# Patient Record
Sex: Male | Born: 1965 | Race: White | Hispanic: No | Marital: Married | Smoking: Never smoker
Health system: Southern US, Community
[De-identification: ages and names within clinical notes are randomized; demographics above are authoritative.]

## PROBLEM LIST (undated history)

## (undated) DIAGNOSIS — Z21 Asymptomatic human immunodeficiency virus [HIV] infection status: Secondary | ICD-10-CM

## (undated) DIAGNOSIS — B20 Human immunodeficiency virus [HIV] disease: Secondary | ICD-10-CM

## (undated) DIAGNOSIS — B1910 Unspecified viral hepatitis B without hepatic coma: Secondary | ICD-10-CM

## (undated) DIAGNOSIS — K625 Hemorrhage of anus and rectum: Secondary | ICD-10-CM

## (undated) DIAGNOSIS — R51 Headache: Secondary | ICD-10-CM

## (undated) DIAGNOSIS — Z9852 Vasectomy status: Secondary | ICD-10-CM

## (undated) DIAGNOSIS — Z973 Presence of spectacles and contact lenses: Secondary | ICD-10-CM

## (undated) DIAGNOSIS — K4090 Unilateral inguinal hernia, without obstruction or gangrene, not specified as recurrent: Secondary | ICD-10-CM

## (undated) DIAGNOSIS — L290 Pruritus ani: Secondary | ICD-10-CM

## (undated) HISTORY — DX: Vasectomy status: Z98.52

## (undated) HISTORY — DX: Unilateral inguinal hernia, without obstruction or gangrene, not specified as recurrent: K40.90

## (undated) HISTORY — PX: HERNIA REPAIR: SHX51

## (undated) HISTORY — DX: Human immunodeficiency virus (HIV) disease: B20

## (undated) HISTORY — DX: Unspecified viral hepatitis B without hepatic coma: B19.10

## (undated) HISTORY — DX: Pruritus ani: L29.0

## (undated) HISTORY — PX: ANTERIOR CRUCIATE LIGAMENT REPAIR: SHX115

## (undated) HISTORY — DX: Headache: R51

## (undated) HISTORY — DX: Hemorrhage of anus and rectum: K62.5

## (undated) HISTORY — DX: Presence of spectacles and contact lenses: Z97.3

## (undated) HISTORY — DX: Asymptomatic human immunodeficiency virus (hiv) infection status: Z21

---

## 2003-06-14 ENCOUNTER — Emergency Department (HOSPITAL_COMMUNITY): Admission: EM | Admit: 2003-06-14 | Discharge: 2003-06-14 | Payer: Self-pay | Admitting: Emergency Medicine

## 2010-07-19 ENCOUNTER — Other Ambulatory Visit: Payer: Self-pay | Admitting: Family Medicine

## 2010-07-19 ENCOUNTER — Ambulatory Visit
Admission: RE | Admit: 2010-07-19 | Discharge: 2010-07-19 | Disposition: A | Discharge status: Home / Self Care | Payer: BLUE CROSS/BLUE SHIELD | Source: Ambulatory Visit | Attending: Family Medicine | Admitting: Family Medicine

## 2010-07-19 DIAGNOSIS — M25572 Pain in left ankle and joints of left foot: Secondary | ICD-10-CM

## 2010-07-30 ENCOUNTER — Ambulatory Visit: Payer: BC Managed Care – PPO

## 2010-07-30 DIAGNOSIS — F419 Anxiety disorder, unspecified: Secondary | ICD-10-CM

## 2010-07-30 DIAGNOSIS — B009 Herpesviral infection, unspecified: Secondary | ICD-10-CM

## 2010-07-30 DIAGNOSIS — Z8619 Personal history of other infectious and parasitic diseases: Secondary | ICD-10-CM

## 2010-07-30 DIAGNOSIS — B20 Human immunodeficiency virus [HIV] disease: Secondary | ICD-10-CM

## 2010-08-30 DIAGNOSIS — G43909 Migraine, unspecified, not intractable, without status migrainosus: Secondary | ICD-10-CM | POA: Insufficient documentation

## 2010-08-30 DIAGNOSIS — Z8619 Personal history of other infectious and parasitic diseases: Secondary | ICD-10-CM | POA: Insufficient documentation

## 2010-08-30 DIAGNOSIS — B20 Human immunodeficiency virus [HIV] disease: Secondary | ICD-10-CM | POA: Insufficient documentation

## 2010-08-30 DIAGNOSIS — Z21 Asymptomatic human immunodeficiency virus [HIV] infection status: Secondary | ICD-10-CM | POA: Insufficient documentation

## 2010-08-30 DIAGNOSIS — F419 Anxiety disorder, unspecified: Secondary | ICD-10-CM | POA: Insufficient documentation

## 2010-08-30 DIAGNOSIS — B009 Herpesviral infection, unspecified: Secondary | ICD-10-CM | POA: Insufficient documentation

## 2010-09-18 ENCOUNTER — Other Ambulatory Visit: Payer: BC Managed Care – PPO

## 2010-09-18 DIAGNOSIS — B20 Human immunodeficiency virus [HIV] disease: Secondary | ICD-10-CM

## 2010-09-18 DIAGNOSIS — Z79899 Other long term (current) drug therapy: Secondary | ICD-10-CM

## 2010-09-18 DIAGNOSIS — Z113 Encounter for screening for infections with a predominantly sexual mode of transmission: Secondary | ICD-10-CM

## 2010-09-18 LAB — COMPREHENSIVE METABOLIC PANEL
ALT: 15 U/L (ref 0–53)
AST: 19 U/L (ref 0–37)
Albumin: 4.1 g/dL (ref 3.5–5.2)
Alkaline Phosphatase: 64 U/L (ref 39–117)
BUN: 15 mg/dL (ref 6–23)
Potassium: 4.1 mEq/L (ref 3.5–5.3)

## 2010-09-18 LAB — CBC WITH DIFFERENTIAL/PLATELET
Basophils Absolute: 0 10*3/uL (ref 0.0–0.1)
Eosinophils Relative: 8 % — ABNORMAL HIGH (ref 0–5)
HCT: 43.6 % (ref 39.0–52.0)
Hemoglobin: 15.1 g/dL (ref 13.0–17.0)
Lymphocytes Relative: 46 % (ref 12–46)
MCHC: 34.6 g/dL (ref 30.0–36.0)
MCV: 92.4 fL (ref 78.0–100.0)
Monocytes Absolute: 0.3 10*3/uL (ref 0.1–1.0)
Monocytes Relative: 6 % (ref 3–12)
RDW: 12.7 % (ref 11.5–15.5)
WBC: 5 10*3/uL (ref 4.0–10.5)

## 2010-09-18 LAB — LIPID PANEL
Cholesterol: 184 mg/dL (ref 0–200)
Triglycerides: 125 mg/dL (ref <150)
VLDL: 25 mg/dL (ref 0–40)

## 2010-09-18 LAB — URINALYSIS, ROUTINE W REFLEX MICROSCOPIC
Bilirubin Urine: NEGATIVE
Leukocytes, UA: NEGATIVE
Nitrite: NEGATIVE
Protein, ur: NEGATIVE mg/dL
Specific Gravity, Urine: 1.026 (ref 1.005–1.030)
Urobilinogen, UA: 0.2 mg/dL (ref 0.0–1.0)

## 2010-09-18 LAB — RPR

## 2010-09-19 LAB — T-HELPER CELL (CD4) - (RCID CLINIC ONLY): CD4 % Helper T Cell: 37 % (ref 33–55)

## 2010-10-01 NOTE — Progress Notes (Signed)
Pt is transferring from [facility]. He lives in [place] and his wife is also a patient at this clinic.  Extensive records received for physician to review.  Pt is very compliant.  Complete intake not performed due to recent visit with [facility] ID. Pt will return in Aug. 2012 for labs and OV with Dr Luciana Axe.

## 2010-10-04 ENCOUNTER — Encounter: Payer: Self-pay | Admitting: Internal Medicine

## 2010-10-04 ENCOUNTER — Ambulatory Visit (INDEPENDENT_AMBULATORY_CARE_PROVIDER_SITE_OTHER): Payer: BC Managed Care – PPO | Admitting: Internal Medicine

## 2010-10-04 VITALS — BP 136/90 | HR 94 | Temp 98.4°F | Wt 186.5 lb

## 2010-10-04 DIAGNOSIS — B20 Human immunodeficiency virus [HIV] disease: Secondary | ICD-10-CM

## 2010-10-04 DIAGNOSIS — Z23 Encounter for immunization: Secondary | ICD-10-CM

## 2010-10-04 DIAGNOSIS — Z21 Asymptomatic human immunodeficiency virus [HIV] infection status: Secondary | ICD-10-CM

## 2010-10-04 MED ORDER — RALTEGRAVIR POTASSIUM 400 MG PO TABS: 400.0000 mg | ORAL_TABLET | Freq: Two times a day (BID) | ORAL | Status: DC

## 2010-10-04 MED ORDER — EMTRICITABINE-TENOFOVIR DF 200-300 MG PO TABS
1.0000 | ORAL_TABLET | Freq: Every day | ORAL | Status: DC
Start: 2010-10-04 — End: 2010-10-14

## 2010-10-04 NOTE — Assessment & Plan Note (Signed)
This patient is doing well on his current regimen and this will be continued. I did refill his prescriptions through [facility] and gave him 3 month supplies with 3 refills. I'm going to have him return in 4-5 months. I discussed condom use intervention. I also discussed long-term side effects of the medications. I discussed the benefits of exercise healthy eating and taking care of herself.

## 2010-10-04 NOTE — Progress Notes (Signed)
  Subjective:    Patient ID: Sean Lawrence, male    DOB: 11/15/1965, 45 y.o.   MRN: [id number]  HPI this patient is a 45 year old male with a history of HIV first diagnosed in February of 2011. Prior to that he did have a negative HIV test. He describes to me symptoms of acute HIV in September of 2010 and underwent HIV testing in February 2011 which was positive. He his initial workup was done at [facility] and he continued to followup there until this appointment. He was started on a regimen of I. Mordecai Maes and Truvada and has been taking that since. His CD4 nadir is 500 and his viral load soon became detectable after starting his regimen in April 2011.  He had been evaluated by nephrology at [facility] due to some mild proteinuria and elevated creatinine which has since resolved. 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 Other diagnoses at the time are considered however it did resolve with Valtrex and therefore he has continued.  Today he has no complaints and he continues to have 100% compliant with his regimen. He continues his work with [employer] access. He is married he is married to his wife who has a long history of HIV and is controlled on Atripla and has an undetectable viral load.    Review of Systems  Constitutional: Negative.   HENT: Negative.   Eyes: Negative.   Respiratory: Negative.   Cardiovascular: Negative.   Gastrointestinal: Negative.   Genitourinary: Negative.   Musculoskeletal: Negative.   Skin: Negative.   Neurological: Negative.   Hematological: Negative.   Psychiatric/Behavioral: Negative.        Objective:   Physical Exam  Constitutional: He is oriented to person, place, and time. He appears well-developed and well-nourished. No distress.  HENT:  Mouth/Throat: No oropharyngeal  exudate.  Eyes: No scleral icterus.  Neck: Normal range of motion. Neck supple.  Cardiovascular: Normal rate, regular rhythm and normal heart sounds.   No murmur heard. Pulmonary/Chest: Effort normal and breath sounds normal. No respiratory distress. He has no wheezes.  Abdominal: Soft. Bowel sounds are normal. There is no tenderness.  Lymphadenopathy:    He has no cervical adenopathy.  Neurological: He is alert and oriented to person, place, and time.  Skin: Skin is warm and dry. No erythema.  Psychiatric: He has a normal mood and affect. His behavior is normal.          Assessment & Plan:

## 2010-10-14 ENCOUNTER — Other Ambulatory Visit: Payer: Self-pay | Admitting: *Deleted

## 2010-10-14 ENCOUNTER — Telehealth: Payer: Self-pay | Admitting: *Deleted

## 2010-10-14 DIAGNOSIS — B009 Herpesviral infection, unspecified: Secondary | ICD-10-CM

## 2010-10-14 DIAGNOSIS — B20 Human immunodeficiency virus [HIV] disease: Secondary | ICD-10-CM

## 2010-10-14 MED ORDER — VALACYCLOVIR HCL 500 MG PO TABS: 500.0000 mg | ORAL_TABLET | Freq: Two times a day (BID) | ORAL | Status: DC

## 2010-10-14 MED ORDER — EMTRICITABINE-TENOFOVIR DF 200-300 MG PO TABS: 1.0000 | ORAL_TABLET | Freq: Every day | ORAL | Status: DC

## 2010-10-14 MED ORDER — RALTEGRAVIR POTASSIUM 400 MG PO TABS: 400.0000 mg | ORAL_TABLET | Freq: Two times a day (BID) | ORAL | Status: DC

## 2010-10-14 NOTE — Telephone Encounter (Signed)
Error

## 2011-02-06 ENCOUNTER — Other Ambulatory Visit (INDEPENDENT_AMBULATORY_CARE_PROVIDER_SITE_OTHER): Payer: BC Managed Care – PPO

## 2011-02-06 ENCOUNTER — Other Ambulatory Visit: Payer: Self-pay | Admitting: Infectious Diseases

## 2011-02-06 DIAGNOSIS — B20 Human immunodeficiency virus [HIV] disease: Secondary | ICD-10-CM

## 2011-02-06 LAB — COMPLETE METABOLIC PANEL WITH GFR
ALT: 16 U/L (ref 0–53)
AST: 19 U/L (ref 0–37)
Calcium: 9.4 mg/dL (ref 8.4–10.5)
Chloride: 110 mEq/L (ref 96–112)
Creat: 1.17 mg/dL (ref 0.50–1.35)
Sodium: 143 mEq/L (ref 135–145)
Total Bilirubin: 0.6 mg/dL (ref 0.3–1.2)
Total Protein: 6.6 g/dL (ref 6.0–8.3)

## 2011-02-06 LAB — CBC WITH DIFFERENTIAL/PLATELET
Eosinophils Absolute: 0.4 10*3/uL (ref 0.0–0.7)
Eosinophils Relative: 8 % — ABNORMAL HIGH (ref 0–5)
Hemoglobin: 15.4 g/dL (ref 13.0–17.0)
Lymphs Abs: 1.9 10*3/uL (ref 0.7–4.0)
MCH: 31.9 pg (ref 26.0–34.0)
MCHC: 34.7 g/dL (ref 30.0–36.0)
MCV: 91.9 fL (ref 78.0–100.0)
Monocytes Relative: 5 % (ref 3–12)
RBC: 4.83 MIL/uL (ref 4.22–5.81)

## 2011-02-07 LAB — T-HELPER CELL (CD4) - (RCID CLINIC ONLY): CD4 % Helper T Cell: 41 % (ref 33–55)

## 2011-02-20 ENCOUNTER — Telehealth: Payer: Self-pay | Admitting: *Deleted

## 2011-02-20 ENCOUNTER — Encounter: Payer: Self-pay | Admitting: Internal Medicine

## 2011-02-20 ENCOUNTER — Ambulatory Visit (INDEPENDENT_AMBULATORY_CARE_PROVIDER_SITE_OTHER): Payer: BC Managed Care – PPO | Admitting: Internal Medicine

## 2011-02-20 DIAGNOSIS — B20 Human immunodeficiency virus [HIV] disease: Secondary | ICD-10-CM

## 2011-02-20 DIAGNOSIS — Z21 Asymptomatic human immunodeficiency virus [HIV] infection status: Secondary | ICD-10-CM

## 2011-02-20 DIAGNOSIS — K648 Other hemorrhoids: Secondary | ICD-10-CM

## 2011-02-20 DIAGNOSIS — Z113 Encounter for screening for infections with a predominantly sexual mode of transmission: Secondary | ICD-10-CM

## 2011-02-20 NOTE — Telephone Encounter (Signed)
Called patient and notified of appointment with [facility] with Dr. Carolynne Edouard for 03/06/11 at 11:00 AM Sean Lawrence CMA

## 2011-02-21 ENCOUNTER — Encounter: Payer: Self-pay | Admitting: Internal Medicine

## 2011-02-21 DIAGNOSIS — K648 Other hemorrhoids: Secondary | ICD-10-CM | POA: Insufficient documentation

## 2011-02-21 NOTE — Progress Notes (Signed)
  Subjective:    Patient ID: Sean Lawrence, male    DOB: 10/16/1965, 46 y.o.   MRN: [id number]  HPI he comes in for routine followup.  He has continued on Isentress and Truvada which he started after diagnosis, which was Feb 2011.  He continues to have excellent compliance with no missed doses and no new issues.  His labs show an undetectable viral load.  He does have a history of a transient increase in his creat at [facility] but that resolved without intervention.      Review of Systems  Constitutional: Negative for fever, appetite change, fatigue and unexpected weight change.  HENT: Negative for sore throat and trouble swallowing.   Respiratory: Negative for cough and shortness of breath.   Cardiovascular: Negative for chest pain and leg swelling.  Gastrointestinal: Negative for nausea, abdominal pain and diarrhea.  Genitourinary: Negative for discharge and genital sores.  Musculoskeletal: Negative for myalgias and arthralgias.  Skin: Negative for rash.  Neurological: Negative for facial asymmetry and headaches.  Hematological: Negative for adenopathy.  Psychiatric/Behavioral: Negative for dysphoric mood. The patient is not nervous/anxious.        Objective:   Physical Exam  Constitutional: He is oriented to person, place, and time. He appears well-developed and well-nourished. No distress.  HENT:  Mouth/Throat: Oropharynx is clear and moist. No oropharyngeal exudate.  Cardiovascular: Normal rate, regular rhythm and normal heart sounds.  Exam reveals no gallop and no friction rub.   No murmur heard. Pulmonary/Chest: Effort normal and breath sounds normal. No respiratory distress. He has no wheezes.  Abdominal: Soft. Bowel sounds are normal. He exhibits no distension. There is no tenderness. There is no rebound.  Genitourinary: Penis normal. No penile tenderness.  Lymphadenopathy:    He has no cervical adenopathy.  Neurological: He is alert and oriented to person, place, and  time.  Skin: Skin is warm and dry. No rash noted. No erythema.  Psychiatric: He has a normal mood and affect. His behavior is normal.          Assessment & Plan:

## 2011-02-21 NOTE — Assessment & Plan Note (Signed)
He is complaining about hemorrhoids, nothing external.  I discussed possible treatments including sitz baths, preparation H, etc.. However he would prefer more definitive therapy.  I have referred him to surgery to further evaluate.

## 2011-02-21 NOTE — Assessment & Plan Note (Signed)
He has good compliance and no new issues.  He did ask about crushing Atripla (his wife's medicine) and I will check with the pharmacist.  He will return in 6 months, sooner as indicated.

## 2011-03-06 ENCOUNTER — Ambulatory Visit (INDEPENDENT_AMBULATORY_CARE_PROVIDER_SITE_OTHER): Payer: BC Managed Care – PPO | Admitting: General Surgery

## 2011-03-06 ENCOUNTER — Encounter (INDEPENDENT_AMBULATORY_CARE_PROVIDER_SITE_OTHER): Payer: Self-pay | Admitting: General Surgery

## 2011-03-06 VITALS — BP 132/90 | HR 65 | Temp 99.2°F | Ht 71.0 in | Wt 179.6 lb

## 2011-03-06 DIAGNOSIS — L290 Pruritus ani: Secondary | ICD-10-CM

## 2011-03-06 NOTE — Progress Notes (Signed)
Subjective:     Patient ID: Sean Lawrence, male   DOB: [birth year], 46 y.o.   MRN: [id number]  HPI We're asked to see the patient in consultation by Dr. Staci Righter to evaluate him for hemorrhoids. The patient is a 46 year old white male who has had a history of occasional bleeding when he wipes after a bowel movement for the last 8-10 years. He also notes a burning sensation after bowel movements but no acute pain. He denies any fevers or chills. He denies any problems with constipation. He does have a history of HIV and the medicines that he has to take for that cause him to have very soft bowel movements.  Review of Systems  Constitutional: Negative.   HENT: Negative.   Eyes: Negative.   Respiratory: Negative.   Cardiovascular: Negative.   Gastrointestinal: Positive for rectal pain.  Genitourinary: Negative.   Musculoskeletal: Negative.   Skin: Negative.   Neurological: Negative.   Hematological: Negative.   Psychiatric/Behavioral: Negative.        Objective:   Physical Exam  Constitutional: He is oriented to person, place, and time. He appears well-developed and well-nourished.  HENT:  Head: Normocephalic and atraumatic.  Eyes: Conjunctivae and EOM are normal. Pupils are equal, round, and reactive to light.  Neck: Normal range of motion. Neck supple.  Cardiovascular: Normal rate, regular rhythm and normal heart sounds.   Pulmonary/Chest: Effort normal and breath sounds normal.  Abdominal: Soft. Bowel sounds are normal.  Genitourinary:       His perirectal skin has some mild circumferential irritation with some cracking of the skin. No obvious external hemorrhoids. On digital exam there is no mass. On anoscopic exam he has no significantly enlarged internal hemorrhoidal tissue or irritation  Musculoskeletal: Normal range of motion.  Neurological: He is alert and oriented to person, place, and time.  Skin: Skin is warm and dry.  Psychiatric: He has a normal mood and affect.  His behavior is normal.       Assessment:     Probable pruritus ani    Plan:     At this point I would recommend using baby wipes after bowel movements. We will have him start using Calmoseptine cream 2-3 times a day and after bowel movements. We will plan to see him back in about a month to check his progress

## 2011-03-06 NOTE — Patient Instructions (Signed)
Baby wipes after BM's Calmoseptine to rectum 2-3 times a day and as needed

## 2011-03-22 ENCOUNTER — Other Ambulatory Visit: Payer: Self-pay | Admitting: Internal Medicine

## 2011-04-07 ENCOUNTER — Encounter (INDEPENDENT_AMBULATORY_CARE_PROVIDER_SITE_OTHER): Payer: Self-pay | Admitting: General Surgery

## 2011-04-07 ENCOUNTER — Ambulatory Visit (INDEPENDENT_AMBULATORY_CARE_PROVIDER_SITE_OTHER): Payer: BC Managed Care – PPO | Admitting: General Surgery

## 2011-04-07 VITALS — BP 128/72 | HR 64 | Temp 99.1°F | Resp 18 | Ht 71.0 in | Wt 182.4 lb

## 2011-04-07 DIAGNOSIS — L290 Pruritus ani: Secondary | ICD-10-CM

## 2011-04-07 NOTE — Patient Instructions (Signed)
Continue with a barrier cream daily

## 2011-04-08 ENCOUNTER — Encounter (INDEPENDENT_AMBULATORY_CARE_PROVIDER_SITE_OTHER): Payer: Self-pay | Admitting: General Surgery

## 2011-04-08 NOTE — Progress Notes (Signed)
Subjective:     Patient ID: Sean Lawrence, male   DOB: 1965/06/10, 46 y.o.   MRN: [id number]  HPI The patient is a 46 year old white male who saw about a month ago with pruritus ani. Since then he has been using Calmoseptine cream several times a day and noted significant improvement. He denies any rectal discomfort. He has not had any bleeding with his bowel movements. Overall he is very happy.  Review of Systems     Objective:   Physical Exam On exam his perirectal skin looks much healthier. There were only a couple little areas of minor irritation.    Assessment:     Pruritus ani nearly resolved    Plan:     At this point I think he can use a barrier cream on a daily basis to continue keeping his perirectal skin healthy. We will plan to see him back on a p.r.n. basis

## 2011-08-12 ENCOUNTER — Other Ambulatory Visit: Payer: Self-pay | Admitting: Internal Medicine

## 2011-08-26 ENCOUNTER — Other Ambulatory Visit: Payer: BC Managed Care – PPO

## 2011-08-26 DIAGNOSIS — B20 Human immunodeficiency virus [HIV] disease: Secondary | ICD-10-CM

## 2011-08-26 DIAGNOSIS — Z113 Encounter for screening for infections with a predominantly sexual mode of transmission: Secondary | ICD-10-CM

## 2011-08-26 LAB — COMPREHENSIVE METABOLIC PANEL
ALT: 28 U/L (ref 0–53)
AST: 24 U/L (ref 0–37)
Albumin: 4.1 g/dL (ref 3.5–5.2)
Calcium: 9 mg/dL (ref 8.4–10.5)
Chloride: 110 mEq/L (ref 96–112)
Potassium: 3.9 mEq/L (ref 3.5–5.3)

## 2011-08-26 LAB — CBC WITH DIFFERENTIAL/PLATELET
Basophils Absolute: 0 10*3/uL (ref 0.0–0.1)
HCT: 40.4 % (ref 39.0–52.0)
Lymphocytes Relative: 34 % (ref 12–46)
Lymphs Abs: 1.7 10*3/uL (ref 0.7–4.0)
Monocytes Absolute: 0.3 10*3/uL (ref 0.1–1.0)
Neutro Abs: 2.3 10*3/uL (ref 1.7–7.7)
Platelets: 185 10*3/uL (ref 150–400)
RBC: 4.53 MIL/uL (ref 4.22–5.81)
RDW: 13.8 % (ref 11.5–15.5)
WBC: 4.9 10*3/uL (ref 4.0–10.5)

## 2011-08-27 LAB — RPR

## 2011-09-09 ENCOUNTER — Encounter: Payer: Self-pay | Admitting: Internal Medicine

## 2011-09-09 ENCOUNTER — Ambulatory Visit (INDEPENDENT_AMBULATORY_CARE_PROVIDER_SITE_OTHER): Payer: BC Managed Care – PPO | Admitting: Internal Medicine

## 2011-09-09 VITALS — BP 125/85 | HR 83 | Temp 98.4°F | Ht 71.0 in | Wt 183.0 lb

## 2011-09-09 DIAGNOSIS — Z21 Asymptomatic human immunodeficiency virus [HIV] infection status: Secondary | ICD-10-CM

## 2011-09-09 DIAGNOSIS — B009 Herpesviral infection, unspecified: Secondary | ICD-10-CM

## 2011-09-09 DIAGNOSIS — B20 Human immunodeficiency virus [HIV] disease: Secondary | ICD-10-CM

## 2011-09-09 DIAGNOSIS — Z113 Encounter for screening for infections with a predominantly sexual mode of transmission: Secondary | ICD-10-CM

## 2011-09-09 DIAGNOSIS — Z79899 Other long term (current) drug therapy: Secondary | ICD-10-CM

## 2011-09-09 NOTE — Assessment & Plan Note (Signed)
He continues to do well with excellent compliance. No new issues. He will continue with his current regimen. Turn in 6 months.

## 2011-09-09 NOTE — Progress Notes (Signed)
  Subjective:    Patient ID: Sean Lawrence, male    DOB: May 19, 1965, 46 y.o.   MRN: [id number]  HPI Sean Lawrence comes in here for his routine followup. He continues on his current regimen of Isentress and Truvada.  He continues to have excellent adherence and tolerance.  No new issues.  He does take Valtrex for HSV prophylaxis but in futher discussion, it appears he never had a positive antibody.  His wife does have it and takes prophylaxis.  No recent hospitalizations.  He continues to lead weekly discussion groups.     Review of Systems  Constitutional: Negative for fever, chills, activity change, fatigue and unexpected weight change.  HENT: Negative for sore throat and trouble swallowing.   Respiratory: Negative for cough and shortness of breath.   Cardiovascular: Negative for chest pain, palpitations and leg swelling.  Gastrointestinal: Negative for nausea, abdominal pain and diarrhea.  Musculoskeletal: Negative for myalgias, joint swelling and arthralgias.  Skin: Negative for rash.  Neurological: Negative for dizziness.  Hematological: Negative for adenopathy.  Psychiatric/Behavioral: Negative for dysphoric mood. The patient is not nervous/anxious.        Objective:   Physical Exam  Constitutional: He appears well-developed and well-nourished. No distress.  HENT:  Mouth/Throat: Oropharynx is clear and moist. No oropharyngeal exudate.  Cardiovascular: Normal rate, regular rhythm and normal heart sounds.  Exam reveals no gallop and no friction rub.   No murmur heard. Pulmonary/Chest: Effort normal and breath sounds normal. No respiratory distress. He has no wheezes. He has no rales.  Lymphadenopathy:    He has no cervical adenopathy.          Assessment & Plan:

## 2011-09-09 NOTE — Assessment & Plan Note (Signed)
I will check the antibodies today. He may not need suppressive therapy. If it is positive, I will continue with Valtrex 500 mg

## 2011-09-10 ENCOUNTER — Other Ambulatory Visit: Payer: Self-pay | Admitting: Internal Medicine

## 2011-09-10 ENCOUNTER — Telehealth: Payer: Self-pay | Admitting: *Deleted

## 2011-09-10 LAB — HSV(HERPES SIMPLEX VRS) I + II AB-IGG
HSV 1 Glycoprotein G Ab, IgG: <0.1 IV
HSV 2 Glycoprotein G Ab, IgG: <0.1 IV

## 2011-09-10 NOTE — Telephone Encounter (Signed)
Message copied by Macy Mis on Wed Sep 10, 2011  2:58 PM ------      Message from: Gardiner Barefoot      Created: Wed Sep 10, 2011  2:38 PM       Can you let Mr. Ramsay know that his HSV antibodies are negative for new or old herpes infection.  Valtrex suppression is not indicated for him.  Thanks.

## 2011-09-10 NOTE — Telephone Encounter (Signed)
Patient notified. Mauriana Dann CMA  

## 2012-03-09 ENCOUNTER — Other Ambulatory Visit (INDEPENDENT_AMBULATORY_CARE_PROVIDER_SITE_OTHER): Payer: BC Managed Care – PPO

## 2012-03-09 DIAGNOSIS — B20 Human immunodeficiency virus [HIV] disease: Secondary | ICD-10-CM

## 2012-03-09 DIAGNOSIS — Z79899 Other long term (current) drug therapy: Secondary | ICD-10-CM

## 2012-03-09 DIAGNOSIS — Z113 Encounter for screening for infections with a predominantly sexual mode of transmission: Secondary | ICD-10-CM

## 2012-03-09 LAB — COMPLETE METABOLIC PANEL WITH GFR
ALT: 30 U/L (ref 0–53)
AST: 14 U/L (ref 0–37)
Albumin: 4.1 g/dL (ref 3.5–5.2)
Calcium: 9.2 mg/dL (ref 8.4–10.5)
Chloride: 110 mEq/L (ref 96–112)
Creat: 1.03 mg/dL (ref 0.50–1.35)
Potassium: 3.9 mEq/L (ref 3.5–5.3)
Sodium: 142 mEq/L (ref 135–145)
Total Protein: 6.5 g/dL (ref 6.0–8.3)

## 2012-03-09 LAB — LIPID PANEL
Cholesterol: 133 mg/dL (ref 0–200)
Total CHOL/HDL Ratio: 4.9 Ratio
VLDL: 19 mg/dL (ref 0–40)

## 2012-03-10 LAB — CBC WITH DIFFERENTIAL/PLATELET
Basophils Absolute: 0 10*3/uL (ref 0.0–0.1)
Basophils Relative: 1 % (ref 0–1)
Eosinophils Absolute: 0.5 10*3/uL (ref 0.0–0.7)
Eosinophils Relative: 8 % — ABNORMAL HIGH (ref 0–5)
HCT: 40.1 % (ref 39.0–52.0)
Hemoglobin: 14.2 g/dL (ref 13.0–17.0)
Lymphocytes Relative: 32 % (ref 12–46)
Lymphs Abs: 1.9 10*3/uL (ref 0.7–4.0)
MCH: 31.3 pg (ref 26.0–34.0)
MCHC: 35.4 g/dL (ref 30.0–36.0)
MCV: 88.3 fL (ref 78.0–100.0)
Monocytes Absolute: 0.4 10*3/uL (ref 0.1–1.0)
Monocytes Relative: 7 % (ref 3–12)
Neutro Abs: 3.1 10*3/uL (ref 1.7–7.7)
Neutrophils Relative %: 52 % (ref 43–77)
Platelets: 249 10*3/uL (ref 150–400)
RBC: 4.54 MIL/uL (ref 4.22–5.81)
RDW: 13.4 % (ref 11.5–15.5)
WBC: 5.8 10*3/uL (ref 4.0–10.5)

## 2012-03-10 LAB — T-HELPER CELL (CD4) - (RCID CLINIC ONLY): CD4 % Helper T Cell: 41 % (ref 33–55)

## 2012-03-10 LAB — HIV-1 RNA QUANT-NO REFLEX-BLD: HIV-1 RNA Quant, Log: <1.3 {Log} (ref <1.30)

## 2012-04-01 ENCOUNTER — Ambulatory Visit (INDEPENDENT_AMBULATORY_CARE_PROVIDER_SITE_OTHER): Payer: BC Managed Care – PPO | Admitting: Internal Medicine

## 2012-04-01 ENCOUNTER — Encounter: Payer: Self-pay | Admitting: Internal Medicine

## 2012-04-01 VITALS — BP 147/95 | HR 67 | Temp 98.2°F | Ht 71.0 in | Wt 180.0 lb

## 2012-04-01 DIAGNOSIS — Z23 Encounter for immunization: Secondary | ICD-10-CM

## 2012-04-01 DIAGNOSIS — B20 Human immunodeficiency virus [HIV] disease: Secondary | ICD-10-CM

## 2012-04-01 NOTE — Progress Notes (Signed)
  Subjective:    Patient ID: Sean Lawrence, male    DOB: 1965-06-20, 47 y.o.   MRN: [id number]  HPI  Sean Lawrence comes in here for his routine followup. He continues on his current regimen of Isentress and Truvada.  He continues to have excellent adherence and tolerance.  No new issues.  His wife does have it and takes prophylaxis.  No recent hospitalizations.  He continues to lead weekly discussion groups.     Review of Systems  Constitutional: Negative for fever, chills, activity change, fatigue and unexpected weight change.  HENT: Negative for sore throat and trouble swallowing.   Respiratory: Negative for cough and shortness of breath.   Cardiovascular: Negative for chest pain, palpitations and leg swelling.  Gastrointestinal: Negative for nausea, abdominal pain and diarrhea.  Musculoskeletal: Negative for myalgias, joint swelling and arthralgias.  Skin: Negative for rash.  Neurological: Negative for dizziness.  Hematological: Negative for adenopathy.  Psychiatric/Behavioral: Negative for dysphoric mood. The patient is not nervous/anxious.        Objective:   Physical Exam  Constitutional: He appears well-developed and well-nourished. No distress.  HENT:  Mouth/Throat: Oropharynx is clear and moist. No oropharyngeal exudate.  Cardiovascular: Normal rate, regular rhythm and normal heart sounds.  Exam reveals no gallop and no friction rub.   No murmur heard. Pulmonary/Chest: Effort normal and breath sounds normal. No respiratory distress. He has no wheezes. He has no rales.  Lymphadenopathy:    He has no cervical adenopathy.          Assessment & Plan:

## 2012-04-01 NOTE — Assessment & Plan Note (Signed)
He continues to do well with no missed doses. He will return on his routine six-month basis. He does get his primary care from his principle physician.

## 2012-08-13 ENCOUNTER — Other Ambulatory Visit: Payer: Self-pay | Admitting: Licensed Clinical Social Worker

## 2012-08-13 DIAGNOSIS — B20 Human immunodeficiency virus [HIV] disease: Secondary | ICD-10-CM

## 2012-08-13 MED ORDER — RALTEGRAVIR POTASSIUM 400 MG PO TABS: ORAL_TABLET | ORAL | Status: DC

## 2012-08-13 MED ORDER — EMTRICITABINE-TENOFOVIR DF 200-300 MG PO TABS: ORAL_TABLET | ORAL | Status: DC

## 2012-09-14 ENCOUNTER — Other Ambulatory Visit: Payer: BC Managed Care – PPO

## 2012-09-14 DIAGNOSIS — B20 Human immunodeficiency virus [HIV] disease: Secondary | ICD-10-CM

## 2012-09-14 LAB — CBC WITH DIFFERENTIAL/PLATELET
Basophils Absolute: 0 10*3/uL (ref 0.0–0.1)
Basophils Relative: 1 % (ref 0–1)
Eosinophils Absolute: 0.5 10*3/uL (ref 0.0–0.7)
Hemoglobin: 14.8 g/dL (ref 13.0–17.0)
MCH: 30.8 pg (ref 26.0–34.0)
MCHC: 34.3 g/dL (ref 30.0–36.0)
Monocytes Relative: 6 % (ref 3–12)
Neutro Abs: 2.5 10*3/uL (ref 1.7–7.7)
Neutrophils Relative %: 45 % (ref 43–77)
Platelets: 214 10*3/uL (ref 150–400)
RDW: 13.9 % (ref 11.5–15.5)

## 2012-09-15 LAB — COMPLETE METABOLIC PANEL WITH GFR
AST: 15 U/L (ref 0–37)
Albumin: 4.2 g/dL (ref 3.5–5.2)
Alkaline Phosphatase: 69 U/L (ref 39–117)
GFR, Est Non African American: 83 mL/min
Glucose, Bld: 93 mg/dL (ref 70–99)
Potassium: 3.7 mEq/L (ref 3.5–5.3)
Sodium: 142 mEq/L (ref 135–145)
Total Bilirubin: 0.5 mg/dL (ref 0.3–1.2)
Total Protein: 6.8 g/dL (ref 6.0–8.3)

## 2012-09-16 LAB — T-HELPER CELL (CD4) - (RCID CLINIC ONLY)
CD4 % Helper T Cell: 40 % (ref 33–55)
CD4 T Cell Abs: 880 uL (ref 400–2700)

## 2012-09-16 LAB — HIV-1 RNA QUANT-NO REFLEX-BLD
HIV 1 RNA Quant: <20 copies/mL (ref <20)
HIV-1 RNA Quant, Log: <1.3 {Log} (ref <1.30)

## 2012-09-28 ENCOUNTER — Ambulatory Visit (INDEPENDENT_AMBULATORY_CARE_PROVIDER_SITE_OTHER): Payer: BC Managed Care – PPO | Admitting: Internal Medicine

## 2012-09-28 ENCOUNTER — Encounter: Payer: Self-pay | Admitting: Internal Medicine

## 2012-09-28 ENCOUNTER — Telehealth: Payer: Self-pay | Admitting: *Deleted

## 2012-09-28 VITALS — BP 130/78 | HR 69 | Temp 98.4°F | Ht 71.0 in | Wt 177.0 lb

## 2012-09-28 DIAGNOSIS — Z21 Asymptomatic human immunodeficiency virus [HIV] infection status: Secondary | ICD-10-CM

## 2012-09-28 DIAGNOSIS — B20 Human immunodeficiency virus [HIV] disease: Secondary | ICD-10-CM

## 2012-09-28 DIAGNOSIS — Z113 Encounter for screening for infections with a predominantly sexual mode of transmission: Secondary | ICD-10-CM

## 2012-09-28 DIAGNOSIS — Z79899 Other long term (current) drug therapy: Secondary | ICD-10-CM

## 2012-09-28 MED ORDER — DOLUTEGRAVIR SODIUM 50 MG PO TABS: 50.0000 mg | ORAL_TABLET | Freq: Every day | ORAL | Status: DC

## 2012-09-28 MED ORDER — EMTRICITABINE-TENOFOVIR DF 200-300 MG PO TABS: ORAL_TABLET | ORAL | Status: DC

## 2012-09-28 NOTE — Telephone Encounter (Signed)
Voice mail from patient requesting Dr. Ephriam Knuckles email to relay medication information. Called back and left him a voicemail stating that since he is active on my chart we prefer he communicate this way. If he has any questions he can call triage nurse back in the AM. Sean Lawrence

## 2012-09-28 NOTE — Assessment & Plan Note (Signed)
He is doing well with his regimen and I am going to change him with his next refill to tivicay with Truvada for ease of dosing. Since he is on Topamax I will avoid the booster was Stribild. Followup in 6 months with fasting labs.

## 2012-09-28 NOTE — Progress Notes (Signed)
  Subjective:    Patient ID: Sean Lawrence, male    DOB: 1965-03-20, 47 y.o.   MRN: [id number]  HPI He comes in for routine followup. He has been on Isentress and Truvada for several years and tolerates it well. He denies any missed doses. No weight loss or diarrhea. No new issues. He has had some back pains go and see his primary physician.   Review of Systems  Constitutional: Negative for fever, chills and unexpected weight change.  HENT: Negative for sore throat and trouble swallowing.   Eyes: Negative for visual disturbance.  Respiratory: Negative for cough and shortness of breath.   Cardiovascular: Negative for chest pain and leg swelling.  Gastrointestinal: Negative for nausea, abdominal pain and diarrhea.  Musculoskeletal: Positive for back pain.  Skin: Negative for rash.  Neurological: Negative for dizziness, light-headedness and headaches.  Hematological: Negative for adenopathy.  Psychiatric/Behavioral: Negative for dysphoric mood.       Objective:   Physical Exam  Constitutional: He is oriented to person, place, and time. He appears well-developed and well-nourished. No distress.  HENT:  Mouth/Throat: No oropharyngeal exudate.  Eyes: Right eye exhibits no discharge. Left eye exhibits no discharge. No scleral icterus.  Cardiovascular: Normal rate, regular rhythm and normal heart sounds.   No murmur heard. Pulmonary/Chest: Effort normal and breath sounds normal. No respiratory distress.  Lymphadenopathy:    He has no cervical adenopathy.  Neurological: He is alert and oriented to person, place, and time.  Skin: Skin is warm and dry. No rash noted.  Psychiatric: He has a normal mood and affect. His behavior is normal.          Assessment & Plan:

## 2012-09-29 ENCOUNTER — Encounter: Payer: Self-pay | Admitting: Internal Medicine

## 2012-12-06 ENCOUNTER — Ambulatory Visit (INDEPENDENT_AMBULATORY_CARE_PROVIDER_SITE_OTHER): Payer: BC Managed Care – PPO

## 2012-12-06 DIAGNOSIS — Z23 Encounter for immunization: Secondary | ICD-10-CM

## 2013-03-24 ENCOUNTER — Other Ambulatory Visit: Payer: BC Managed Care – PPO

## 2013-03-24 DIAGNOSIS — Z79899 Other long term (current) drug therapy: Secondary | ICD-10-CM

## 2013-03-24 DIAGNOSIS — Z113 Encounter for screening for infections with a predominantly sexual mode of transmission: Secondary | ICD-10-CM

## 2013-03-24 DIAGNOSIS — B20 Human immunodeficiency virus [HIV] disease: Secondary | ICD-10-CM

## 2013-03-24 LAB — RPR

## 2013-03-24 LAB — LIPID PANEL
CHOL/HDL RATIO: 4.2 ratio
Cholesterol: 154 mg/dL (ref 0–200)
HDL: 37 mg/dL — AB (ref >39)
LDL Cholesterol: 99 mg/dL (ref 0–99)
Triglycerides: 88 mg/dL (ref <150)
VLDL: 18 mg/dL (ref 0–40)

## 2013-03-24 LAB — CBC WITH DIFFERENTIAL/PLATELET
BASOS PCT: 1 % (ref 0–1)
Basophils Absolute: 0.1 10*3/uL (ref 0.0–0.1)
EOS ABS: 0.5 10*3/uL (ref 0.0–0.7)
EOS PCT: 10 % — AB (ref 0–5)
HCT: 43.2 % (ref 39.0–52.0)
Hemoglobin: 15.8 g/dL (ref 13.0–17.0)
LYMPHS ABS: 1.9 10*3/uL (ref 0.7–4.0)
Lymphocytes Relative: 35 % (ref 12–46)
MCH: 32.3 pg (ref 26.0–34.0)
MCHC: 36.6 g/dL — AB (ref 30.0–36.0)
MCV: 88.3 fL (ref 78.0–100.0)
MONOS PCT: 6 % (ref 3–12)
Monocytes Absolute: 0.3 10*3/uL (ref 0.1–1.0)
Neutro Abs: 2.5 10*3/uL (ref 1.7–7.7)
Neutrophils Relative %: 48 % (ref 43–77)
PLATELETS: 207 10*3/uL (ref 150–400)
RBC: 4.89 MIL/uL (ref 4.22–5.81)
RDW: 13.5 % (ref 11.5–15.5)
WBC: 5.3 10*3/uL (ref 4.0–10.5)

## 2013-03-24 LAB — COMPLETE METABOLIC PANEL WITH GFR
ALT: 25 U/L (ref 0–53)
AST: 26 U/L (ref 0–37)
Albumin: 4.4 g/dL (ref 3.5–5.2)
Alkaline Phosphatase: 58 U/L (ref 39–117)
BUN: 14 mg/dL (ref 6–23)
CO2: 24 mEq/L (ref 19–32)
Calcium: 9.2 mg/dL (ref 8.4–10.5)
Chloride: 109 mEq/L (ref 96–112)
Creat: 1.26 mg/dL (ref 0.50–1.35)
GFR, Est African American: 77 mL/min
GFR, Est Non African American: 67 mL/min
Glucose, Bld: 86 mg/dL (ref 70–99)
POTASSIUM: 3.8 meq/L (ref 3.5–5.3)
Sodium: 143 mEq/L (ref 135–145)
TOTAL PROTEIN: 6.4 g/dL (ref 6.0–8.3)
Total Bilirubin: 1 mg/dL (ref 0.2–1.2)

## 2013-03-25 LAB — T-HELPER CELL (CD4) - (RCID CLINIC ONLY)
CD4 % Helper T Cell: 40 % (ref 33–55)
CD4 T Cell Abs: 730 /uL (ref 400–2700)

## 2013-03-26 LAB — HIV-1 RNA QUANT-NO REFLEX-BLD: HIV 1 RNA Quant: <20 copies/mL (ref <20)

## 2013-04-07 ENCOUNTER — Ambulatory Visit (INDEPENDENT_AMBULATORY_CARE_PROVIDER_SITE_OTHER): Payer: BC Managed Care – PPO | Admitting: Internal Medicine

## 2013-04-07 ENCOUNTER — Encounter: Payer: Self-pay | Admitting: Internal Medicine

## 2013-04-07 VITALS — BP 110/68 | HR 75 | Temp 98.4°F | Ht 71.0 in | Wt 173.0 lb

## 2013-04-07 DIAGNOSIS — Z23 Encounter for immunization: Secondary | ICD-10-CM

## 2013-04-07 DIAGNOSIS — B20 Human immunodeficiency virus [HIV] disease: Secondary | ICD-10-CM

## 2013-04-07 DIAGNOSIS — Z21 Asymptomatic human immunodeficiency virus [HIV] infection status: Secondary | ICD-10-CM

## 2013-04-07 NOTE — Progress Notes (Signed)
  Subjective:    Patient ID: Sean Lawrence, male    DOB: March 03, 1965, 48 y.o.   MRN: [id number]  HPI  He comes in for routine followup. He has been on Isentress and Truvada for several years and tolerates it well. He denies any missed doses. No weight loss or diarrhea. No new issues.    Review of Systems  Constitutional: Negative for fever, chills and unexpected weight change.  HENT: Negative for sore throat and trouble swallowing.   Eyes: Negative for visual disturbance.  Respiratory: Negative for cough and shortness of breath.   Cardiovascular: Negative for chest pain and leg swelling.  Gastrointestinal: Negative for nausea, abdominal pain and diarrhea.  Musculoskeletal: Positive for back pain.  Skin: Negative for rash.  Neurological: Negative for dizziness, light-headedness and headaches.  Hematological: Negative for adenopathy.  Psychiatric/Behavioral: Negative for dysphoric mood.       Objective:   Physical Exam  Constitutional: He is oriented to person, place, and time. He appears well-developed and well-nourished. No distress.  HENT:  Mouth/Throat: No oropharyngeal exudate.  Eyes: Right eye exhibits no discharge. Left eye exhibits no discharge. No scleral icterus.  Cardiovascular: Normal rate, regular rhythm and normal heart sounds.   No murmur heard. Pulmonary/Chest: Effort normal and breath sounds normal. No respiratory distress.  Lymphadenopathy:    He has no cervical adenopathy.  Neurological: He is alert and oriented to person, place, and time.  Skin: Skin is warm and dry. No rash noted.  Psychiatric: He has a normal mood and affect. His behavior is normal.          Assessment & Plan:

## 2013-04-07 NOTE — Assessment & Plan Note (Signed)
Continues to do well and RTC in 6 months.

## 2013-04-07 NOTE — Addendum Note (Signed)
Addended by: Landis Gandy on: 04/07/2013 09:04 AM   Modules accepted: Orders

## 2013-09-20 ENCOUNTER — Other Ambulatory Visit: Payer: Self-pay | Admitting: *Deleted

## 2013-09-20 DIAGNOSIS — B20 Human immunodeficiency virus [HIV] disease: Secondary | ICD-10-CM

## 2013-09-20 MED ORDER — EMTRICITABINE-TENOFOVIR DF 200-300 MG PO TABS: ORAL_TABLET | ORAL | Status: DC

## 2013-09-20 MED ORDER — DOLUTEGRAVIR SODIUM 50 MG PO TABS: 50.0000 mg | ORAL_TABLET | Freq: Every day | ORAL | Status: DC

## 2013-09-22 ENCOUNTER — Other Ambulatory Visit: Payer: BC Managed Care – PPO

## 2013-09-22 DIAGNOSIS — B20 Human immunodeficiency virus [HIV] disease: Secondary | ICD-10-CM

## 2013-09-23 LAB — HIV-1 RNA QUANT-NO REFLEX-BLD
HIV 1 RNA Quant: <20 copies/mL (ref <20)
HIV-1 RNA Quant, Log: <1.3 {Log} (ref <1.30)

## 2013-09-23 LAB — T-HELPER CELL (CD4) - (RCID CLINIC ONLY)
CD4 % Helper T Cell: 42 % (ref 33–55)
CD4 T CELL ABS: 800 /uL (ref 400–2700)

## 2013-10-06 ENCOUNTER — Other Ambulatory Visit: Payer: Self-pay | Admitting: *Deleted

## 2013-10-06 ENCOUNTER — Encounter: Payer: Self-pay | Admitting: Internal Medicine

## 2013-10-06 ENCOUNTER — Ambulatory Visit (INDEPENDENT_AMBULATORY_CARE_PROVIDER_SITE_OTHER): Payer: BC Managed Care – PPO | Admitting: Internal Medicine

## 2013-10-06 VITALS — BP 126/80 | HR 78 | Temp 98.8°F | Wt 167.0 lb

## 2013-10-06 DIAGNOSIS — Z113 Encounter for screening for infections with a predominantly sexual mode of transmission: Secondary | ICD-10-CM

## 2013-10-06 DIAGNOSIS — Z8619 Personal history of other infectious and parasitic diseases: Secondary | ICD-10-CM | POA: Diagnosis not present

## 2013-10-06 DIAGNOSIS — Z23 Encounter for immunization: Secondary | ICD-10-CM

## 2013-10-06 DIAGNOSIS — Z79899 Other long term (current) drug therapy: Secondary | ICD-10-CM | POA: Insufficient documentation

## 2013-10-06 DIAGNOSIS — B20 Human immunodeficiency virus [HIV] disease: Secondary | ICD-10-CM

## 2013-10-06 DIAGNOSIS — Z21 Asymptomatic human immunodeficiency virus [HIV] infection status: Secondary | ICD-10-CM

## 2013-10-06 MED ORDER — EMTRICITABINE-TENOFOVIR DF 200-300 MG PO TABS: ORAL_TABLET | ORAL | Status: DC

## 2013-10-06 MED ORDER — DOLUTEGRAVIR SODIUM 50 MG PO TABS: 50.0000 mg | ORAL_TABLET | Freq: Every day | ORAL | Status: DC

## 2013-10-06 NOTE — Assessment & Plan Note (Signed)
Will check ab next visit for immunity.

## 2013-10-06 NOTE — Progress Notes (Signed)
  Subjective:    Patient ID: OLUWASEMILORE BAHL, male    DOB: 04/19/1965, 48 y.o.   MRN: [id number]  HPI He comes in for routine followup. He has been on Tivicay and Truvada since last year and tolerates it well, having switched from Isentress and Truvada. He denies any missed doses. No weight loss or diarrhea. No new issues. Asks about Triumeq after seeing a presentation.  No issues though with two pills.  Back pain much better with inversion therapy.     Review of Systems  Constitutional: Negative for fever, chills and unexpected weight change.  HENT: Negative for sore throat and trouble swallowing.   Eyes: Negative for visual disturbance.  Respiratory: Negative for cough and shortness of breath.   Cardiovascular: Negative for chest pain and leg swelling.  Gastrointestinal: Negative for nausea, abdominal pain and diarrhea.  Musculoskeletal: Positive for back pain.  Skin: Negative for rash.  Neurological: Negative for dizziness, light-headedness and headaches.  Hematological: Negative for adenopathy.  Psychiatric/Behavioral: Negative for dysphoric mood.       Objective:   Physical Exam  Constitutional: He is oriented to person, place, and time. He appears well-developed and well-nourished. No distress.  HENT:  Mouth/Throat: No oropharyngeal exudate.  Eyes: Right eye exhibits no discharge. Left eye exhibits no discharge. No scleral icterus.  Cardiovascular: Normal rate, regular rhythm and normal heart sounds.   No murmur heard. Pulmonary/Chest: Effort normal and breath sounds normal. No respiratory distress.  Lymphadenopathy:    He has no cervical adenopathy.  Neurological: He is alert and oriented to person, place, and time.  Skin: Skin is warm and dry. No rash noted.  Psychiatric: He has a normal mood and affect. His behavior is normal.          Assessment & Plan:

## 2013-10-06 NOTE — Assessment & Plan Note (Addendum)
Doing great.  Fasting labs next visit.  HLA testing next visit.

## 2013-10-20 ENCOUNTER — Other Ambulatory Visit: Payer: Self-pay | Admitting: *Deleted

## 2013-10-20 DIAGNOSIS — B20 Human immunodeficiency virus [HIV] disease: Secondary | ICD-10-CM

## 2013-10-20 MED ORDER — DOLUTEGRAVIR SODIUM 50 MG PO TABS: 50.0000 mg | ORAL_TABLET | Freq: Every day | ORAL | Status: DC

## 2013-10-20 MED ORDER — EMTRICITABINE-TENOFOVIR DF 200-300 MG PO TABS: ORAL_TABLET | ORAL | Status: DC

## 2013-11-17 ENCOUNTER — Other Ambulatory Visit: Payer: Self-pay | Admitting: Internal Medicine

## 2014-03-02 ENCOUNTER — Other Ambulatory Visit: Payer: Self-pay | Admitting: *Deleted

## 2014-03-02 DIAGNOSIS — B20 Human immunodeficiency virus [HIV] disease: Secondary | ICD-10-CM

## 2014-03-02 MED ORDER — DOLUTEGRAVIR SODIUM 50 MG PO TABS: 50.0000 mg | ORAL_TABLET | Freq: Every day | ORAL | Status: DC

## 2014-03-02 MED ORDER — EMTRICITABINE-TENOFOVIR DF 200-300 MG PO TABS: ORAL_TABLET | ORAL | Status: DC

## 2014-08-23 ENCOUNTER — Other Ambulatory Visit: Payer: Self-pay

## 2014-08-23 DIAGNOSIS — Z8619 Personal history of other infectious and parasitic diseases: Secondary | ICD-10-CM

## 2014-08-23 DIAGNOSIS — Z79899 Other long term (current) drug therapy: Secondary | ICD-10-CM

## 2014-08-23 DIAGNOSIS — B20 Human immunodeficiency virus [HIV] disease: Secondary | ICD-10-CM

## 2014-08-23 DIAGNOSIS — Z113 Encounter for screening for infections with a predominantly sexual mode of transmission: Secondary | ICD-10-CM

## 2014-08-23 LAB — CBC WITH DIFFERENTIAL/PLATELET
Basophils Absolute: 0.1 K/uL (ref 0.0–0.1)
Basophils Relative: 1 % (ref 0–1)
Eosinophils Absolute: 0.6 K/uL (ref 0.0–0.7)
Eosinophils Relative: 10 % — ABNORMAL HIGH (ref 0–5)
HCT: 44.9 % (ref 39.0–52.0)
Hemoglobin: 16 g/dL (ref 13.0–17.0)
Lymphocytes Relative: 31 % (ref 12–46)
Lymphs Abs: 1.8 K/uL (ref 0.7–4.0)
MCH: 32.5 pg (ref 26.0–34.0)
MCHC: 35.6 g/dL (ref 30.0–36.0)
MCV: 91.1 fL (ref 78.0–100.0)
MPV: 9.5 fL (ref 8.6–12.4)
Monocytes Absolute: 0.3 K/uL (ref 0.1–1.0)
Monocytes Relative: 5 % (ref 3–12)
Neutro Abs: 3.1 K/uL (ref 1.7–7.7)
Neutrophils Relative %: 53 % (ref 43–77)
Platelets: 199 K/uL (ref 150–400)
RBC: 4.93 MIL/uL (ref 4.22–5.81)
RDW: 13.8 % (ref 11.5–15.5)
WBC: 5.8 K/uL (ref 4.0–10.5)

## 2014-08-23 LAB — COMPLETE METABOLIC PANEL WITHOUT GFR
ALT: 16 U/L (ref 0–53)
AST: 17 U/L (ref 0–37)
Albumin: 4.3 g/dL (ref 3.5–5.2)
Alkaline Phosphatase: 64 U/L (ref 39–117)
BUN: 15 mg/dL (ref 6–23)
CO2: 25 meq/L (ref 19–32)
Calcium: 9.6 mg/dL (ref 8.4–10.5)
Chloride: 105 meq/L (ref 96–112)
Creat: 1.16 mg/dL (ref 0.50–1.35)
GFR, Est African American: 84 mL/min
GFR, Est Non African American: 73 mL/min
Glucose, Bld: 90 mg/dL (ref 70–99)
Potassium: 4.2 meq/L (ref 3.5–5.3)
Sodium: 143 meq/L (ref 135–145)
Total Bilirubin: 0.9 mg/dL (ref 0.2–1.2)
Total Protein: 7 g/dL (ref 6.0–8.3)

## 2014-08-23 LAB — LIPID PANEL
Cholesterol: 177 mg/dL (ref 0–200)
HDL: 37 mg/dL — ABNORMAL LOW
LDL Cholesterol: 118 mg/dL — ABNORMAL HIGH (ref 0–99)
Total CHOL/HDL Ratio: 4.8 ratio
Triglycerides: 108 mg/dL
VLDL: 22 mg/dL (ref 0–40)

## 2014-08-24 LAB — HEPATITIS A ANTIBODY, TOTAL: Hep A Total Ab: REACTIVE — AB

## 2014-08-24 LAB — HIV-1 RNA QUANT-NO REFLEX-BLD

## 2014-08-24 LAB — T-HELPER CELL (CD4) - (RCID CLINIC ONLY)
CD4 % Helper T Cell: 41 % (ref 33–55)
CD4 T CELL ABS: 660 /uL (ref 400–2700)

## 2014-08-24 LAB — HEPATITIS B SURFACE ANTIBODY,QUALITATIVE: Hep B S Ab: POSITIVE — AB

## 2014-08-24 LAB — RPR

## 2014-08-24 LAB — HEPATITIS C ANTIBODY: HCV Ab: NEGATIVE

## 2014-08-30 LAB — HLA B*5701: HLA-B*5701 w/rflx HLA-B High: NEGATIVE

## 2014-09-07 ENCOUNTER — Ambulatory Visit (INDEPENDENT_AMBULATORY_CARE_PROVIDER_SITE_OTHER): Payer: 59 | Admitting: Internal Medicine

## 2014-09-07 ENCOUNTER — Encounter: Payer: Self-pay | Admitting: Internal Medicine

## 2014-09-07 VITALS — BP 128/85 | HR 57 | Temp 97.9°F | Wt 168.0 lb

## 2014-09-07 DIAGNOSIS — Z23 Encounter for immunization: Secondary | ICD-10-CM | POA: Diagnosis not present

## 2014-09-07 DIAGNOSIS — Z21 Asymptomatic human immunodeficiency virus [HIV] infection status: Secondary | ICD-10-CM

## 2014-09-07 DIAGNOSIS — M549 Dorsalgia, unspecified: Secondary | ICD-10-CM | POA: Insufficient documentation

## 2014-09-07 DIAGNOSIS — M545 Low back pain, unspecified: Secondary | ICD-10-CM

## 2014-09-07 DIAGNOSIS — B20 Human immunodeficiency virus [HIV] disease: Secondary | ICD-10-CM

## 2014-09-07 NOTE — Assessment & Plan Note (Signed)
Doing great.  Discussed TAF and hopefully will be able to change soon. RTC 6 months

## 2014-09-07 NOTE — Progress Notes (Signed)
  Subjective:    Patient ID: Sean Lawrence, male    DOB: [birth year], 49 y.o.   MRN: [id number]  HPI He comes in for routine followup. He has been on Tivicay and Truvada since last year and tolerates it well, having switched from Isentress and Truvada. He denies any missed doses. No weight loss or diarrhea. No new issues.  No issues though with two pills.  Back pain much better with inversion therapy.   Labs reviewed and CD4 660, viral load remains undetectable.    Review of Systems  Constitutional: Negative for fever, chills and unexpected weight change.  HENT: Negative for sore throat and trouble swallowing.   Eyes: Negative for visual disturbance.  Respiratory: Negative for cough and shortness of breath.   Cardiovascular: Negative for chest pain and leg swelling.  Gastrointestinal: Negative for nausea, abdominal pain and diarrhea.  Musculoskeletal: Positive for back pain.  Skin: Negative for rash.  Neurological: Negative for dizziness, light-headedness and headaches.  Hematological: Negative for adenopathy.  Psychiatric/Behavioral: Negative for dysphoric mood.       Objective:   Physical Exam  Constitutional: He is oriented to person, place, and time. He appears well-developed and well-nourished. No distress.  HENT:  Mouth/Throat: No oropharyngeal exudate.  Eyes: Right eye exhibits no discharge. Left eye exhibits no discharge. No scleral icterus.  Cardiovascular: Normal rate, regular rhythm and normal heart sounds.   No murmur heard. Pulmonary/Chest: Effort normal and breath sounds normal. No respiratory distress.  Lymphadenopathy:    He has no cervical adenopathy.  Neurological: He is alert and oriented to person, place, and time.  Skin: Skin is warm and dry. No rash noted.  Psychiatric: He has a normal mood and affect. His behavior is normal.          Assessment & Plan:

## 2014-09-07 NOTE — Addendum Note (Signed)
Addended by: Landis Gandy on: 09/07/2014 05:22 PM   Modules accepted: Orders

## 2015-02-22 ENCOUNTER — Other Ambulatory Visit: Payer: 59

## 2015-02-22 DIAGNOSIS — B20 Human immunodeficiency virus [HIV] disease: Secondary | ICD-10-CM

## 2015-02-23 LAB — T-HELPER CELL (CD4) - (RCID CLINIC ONLY)
CD4 T CELL HELPER: 39 % (ref 33–55)
CD4 T Cell Abs: 840 /uL (ref 400–2700)

## 2015-02-23 LAB — HIV-1 RNA QUANT-NO REFLEX-BLD: HIV-1 RNA Quant, Log: <1.3 Log copies/mL (ref <1.30)

## 2015-03-05 ENCOUNTER — Other Ambulatory Visit: Payer: Self-pay | Admitting: *Deleted

## 2015-03-05 DIAGNOSIS — B20 Human immunodeficiency virus [HIV] disease: Secondary | ICD-10-CM

## 2015-03-05 MED ORDER — DOLUTEGRAVIR SODIUM 50 MG PO TABS: 50.0000 mg | ORAL_TABLET | Freq: Every day | ORAL | Status: DC

## 2015-03-05 MED ORDER — EMTRICITABINE-TENOFOVIR DF 200-300 MG PO TABS: ORAL_TABLET | ORAL | Status: DC

## 2015-03-08 ENCOUNTER — Encounter: Payer: Self-pay | Admitting: Internal Medicine

## 2015-03-08 ENCOUNTER — Ambulatory Visit (INDEPENDENT_AMBULATORY_CARE_PROVIDER_SITE_OTHER): Payer: 59 | Admitting: Internal Medicine

## 2015-03-08 VITALS — BP 129/89 | HR 64 | Temp 97.8°F | Ht 71.0 in | Wt 173.0 lb

## 2015-03-08 DIAGNOSIS — Z113 Encounter for screening for infections with a predominantly sexual mode of transmission: Secondary | ICD-10-CM

## 2015-03-08 DIAGNOSIS — M545 Low back pain, unspecified: Secondary | ICD-10-CM

## 2015-03-08 DIAGNOSIS — Z79899 Other long term (current) drug therapy: Secondary | ICD-10-CM

## 2015-03-08 DIAGNOSIS — B20 Human immunodeficiency virus [HIV] disease: Secondary | ICD-10-CM

## 2015-03-08 DIAGNOSIS — Z21 Asymptomatic human immunodeficiency virus [HIV] infection status: Secondary | ICD-10-CM | POA: Diagnosis not present

## 2015-03-08 MED ORDER — EMTRICITABINE-TENOFOVIR AF 200-25 MG PO TABS: 1.0000 | ORAL_TABLET | Freq: Every day | ORAL | Status: DC

## 2015-03-09 NOTE — Assessment & Plan Note (Signed)
I will try to get him on descovy in place of truvada if covered.  rtc 6 months.

## 2015-03-09 NOTE — Assessment & Plan Note (Signed)
Stable with inversion therapy.

## 2015-03-09 NOTE — Progress Notes (Signed)
  Subjective:    Patient ID: Sean Lawrence, male    DOB: 03/27/1965, 49 y.o.   MRN: [id number]  HPI He comes in for routine followup.  He has been on Tivicay and Truvada since last year and tolerates it well, having switched from [place] and Truvada. He denies any missed doses. No weight loss or diarrhea. No new issues.  No issues though with two pills.  Labs reviewed and CD4 840, viral load remains undetectable.  No new issues.    Review of Systems  Constitutional: Negative for unexpected weight change.  HENT: Negative for sore throat and trouble swallowing.   Gastrointestinal: Negative for diarrhea.  Musculoskeletal: Negative for back pain.  Skin: Negative for rash.  Neurological: Negative for dizziness.  Hematological: Negative for adenopathy.       Objective:   Physical Exam  Constitutional: He appears well-developed and well-nourished. No distress.  HENT:  Mouth/Throat: No oropharyngeal exudate.  Eyes: Right eye exhibits no discharge. Left eye exhibits no discharge. No scleral icterus.  Cardiovascular: Normal rate, regular rhythm and normal heart sounds.   No murmur heard. Lymphadenopathy:    He has no cervical adenopathy.  Skin: Skin is warm and dry. No rash noted.          Assessment & Plan:

## 2015-08-13 ENCOUNTER — Other Ambulatory Visit: Payer: Self-pay | Admitting: *Deleted

## 2015-08-13 DIAGNOSIS — B20 Human immunodeficiency virus [HIV] disease: Secondary | ICD-10-CM

## 2015-08-13 MED ORDER — EMTRICITABINE-TENOFOVIR AF 200-25 MG PO TABS: 1.0000 | ORAL_TABLET | Freq: Every day | ORAL | Status: DC

## 2015-08-23 ENCOUNTER — Other Ambulatory Visit: Payer: 59

## 2015-08-23 DIAGNOSIS — Z79899 Other long term (current) drug therapy: Secondary | ICD-10-CM

## 2015-08-23 DIAGNOSIS — B20 Human immunodeficiency virus [HIV] disease: Secondary | ICD-10-CM

## 2015-08-23 DIAGNOSIS — Z113 Encounter for screening for infections with a predominantly sexual mode of transmission: Secondary | ICD-10-CM

## 2015-08-23 LAB — CBC WITH DIFFERENTIAL/PLATELET
BASOS PCT: 1 %
Basophils Absolute: 50 cells/uL (ref 0–200)
EOS PCT: 12 %
Eosinophils Absolute: 600 cells/uL — ABNORMAL HIGH (ref 15–500)
HCT: 44.5 % (ref 38.5–50.0)
Hemoglobin: 15.3 g/dL (ref 13.2–17.1)
LYMPHS ABS: 2000 {cells}/uL (ref 850–3900)
Lymphocytes Relative: 40 %
MCH: 31.6 pg (ref 27.0–33.0)
MCHC: 34.4 g/dL (ref 32.0–36.0)
MCV: 91.9 fL (ref 80.0–100.0)
MONOS PCT: 6 %
MPV: 9.1 fL (ref 7.5–12.5)
Monocytes Absolute: 300 cells/uL (ref 200–950)
NEUTROS ABS: 2050 {cells}/uL (ref 1500–7800)
Neutrophils Relative %: 41 %
PLATELETS: 198 10*3/uL (ref 140–400)
RBC: 4.84 MIL/uL (ref 4.20–5.80)
RDW: 14 % (ref 11.0–15.0)
WBC: 5 10*3/uL (ref 3.8–10.8)

## 2015-08-24 LAB — COMPLETE METABOLIC PANEL WITH GFR
ALBUMIN: 3.8 g/dL (ref 3.6–5.1)
ALK PHOS: 52 U/L (ref 40–115)
ALT: 12 U/L (ref 9–46)
AST: 14 U/L (ref 10–35)
BILIRUBIN TOTAL: 0.7 mg/dL (ref 0.2–1.2)
BUN: 15 mg/dL (ref 7–25)
CO2: 22 mmol/L (ref 20–31)
Calcium: 8.9 mg/dL (ref 8.6–10.3)
Chloride: 111 mmol/L — ABNORMAL HIGH (ref 98–110)
Creat: 1.13 mg/dL (ref 0.70–1.33)
GFR, EST AFRICAN AMERICAN: 86 mL/min (ref >=60)
GFR, EST NON AFRICAN AMERICAN: 74 mL/min (ref >=60)
GLUCOSE: 86 mg/dL (ref 65–99)
POTASSIUM: 4.2 mmol/L (ref 3.5–5.3)
SODIUM: 142 mmol/L (ref 135–146)
TOTAL PROTEIN: 6.1 g/dL (ref 6.1–8.1)

## 2015-08-24 LAB — T-HELPER CELL (CD4) - (RCID CLINIC ONLY)
CD4 T CELL HELPER: 41 % (ref 33–55)
CD4 T Cell Abs: 860 /uL (ref 400–2700)

## 2015-08-24 LAB — LIPID PANEL
CHOLESTEROL: 183 mg/dL (ref 125–200)
HDL: 46 mg/dL (ref >=40)
LDL CALC: 121 mg/dL (ref <130)
TRIGLYCERIDES: 80 mg/dL (ref <150)
Total CHOL/HDL Ratio: 4 Ratio (ref <=5.0)
VLDL: 16 mg/dL (ref <30)

## 2015-08-24 LAB — RPR

## 2015-08-24 LAB — HIV-1 RNA QUANT-NO REFLEX-BLD: HIV-1 RNA Quant, Log: <1.3 Log copies/mL (ref <1.30)

## 2015-09-06 ENCOUNTER — Encounter: Payer: Self-pay | Admitting: Internal Medicine

## 2015-09-06 ENCOUNTER — Ambulatory Visit (INDEPENDENT_AMBULATORY_CARE_PROVIDER_SITE_OTHER): Payer: 59 | Admitting: Internal Medicine

## 2015-09-06 VITALS — BP 130/89 | Temp 97.8°F | Wt 175.0 lb

## 2015-09-06 DIAGNOSIS — B20 Human immunodeficiency virus [HIV] disease: Secondary | ICD-10-CM

## 2015-09-06 DIAGNOSIS — Z21 Asymptomatic human immunodeficiency virus [HIV] infection status: Secondary | ICD-10-CM

## 2015-09-06 DIAGNOSIS — Z113 Encounter for screening for infections with a predominantly sexual mode of transmission: Secondary | ICD-10-CM

## 2015-09-06 DIAGNOSIS — F419 Anxiety disorder, unspecified: Secondary | ICD-10-CM

## 2015-09-06 DIAGNOSIS — Z79899 Other long term (current) drug therapy: Secondary | ICD-10-CM

## 2015-09-06 NOTE — Assessment & Plan Note (Signed)
Doing great.  RTC 1 year

## 2015-09-06 NOTE — Assessment & Plan Note (Signed)
No issues

## 2015-09-06 NOTE — Progress Notes (Signed)
  Subjective:    Patient ID: Sean Lawrence, male    DOB: [birth year], 50 y.o.   MRN: [id number]  HPI He comes in for routine followup.  He has been on Tivicay and Descovy since last year and tolerates it well, having switched from [place] and Truvada. He denies any missed doses. No weight loss or diarrhea. No new issues.  No issues though with two pills.  Labs reviewed and CD4 860, viral load remains undetectable.  Broke his ankle in April.     Review of Systems  Constitutional: Negative for unexpected weight change.  HENT: Negative for sore throat and trouble swallowing.   Gastrointestinal: Negative for diarrhea.  Musculoskeletal: Negative for back pain.  Skin: Negative for rash.  Neurological: Negative for dizziness.  Hematological: Negative for adenopathy.       Objective:   Physical Exam  Constitutional: He appears well-developed and well-nourished. No distress.  HENT:  Mouth/Throat: No oropharyngeal exudate.  Eyes: Right eye exhibits no discharge. Left eye exhibits no discharge. No scleral icterus.  Cardiovascular: Normal rate, regular rhythm and normal heart sounds.   No murmur heard. Lymphadenopathy:    He has no cervical adenopathy.  Skin: Skin is warm and dry. No rash noted.          Assessment & Plan:

## 2016-02-08 ENCOUNTER — Other Ambulatory Visit: Payer: Self-pay | Admitting: *Deleted

## 2016-02-08 DIAGNOSIS — B20 Human immunodeficiency virus [HIV] disease: Secondary | ICD-10-CM

## 2016-02-08 MED ORDER — DOLUTEGRAVIR SODIUM 50 MG PO TABS: 50.0000 mg | ORAL_TABLET | Freq: Every day | ORAL | 3 refills | Status: DC

## 2016-02-08 MED ORDER — EMTRICITABINE-TENOFOVIR AF 200-25 MG PO TABS: 1.0000 | ORAL_TABLET | Freq: Every day | ORAL | 3 refills | Status: DC

## 2016-03-07 DIAGNOSIS — G43719 Chronic migraine without aura, intractable, without status migrainosus: Secondary | ICD-10-CM | POA: Diagnosis not present

## 2016-03-07 DIAGNOSIS — G43019 Migraine without aura, intractable, without status migrainosus: Secondary | ICD-10-CM | POA: Diagnosis not present

## 2016-03-07 DIAGNOSIS — G43111 Migraine with aura, intractable, with status migrainosus: Secondary | ICD-10-CM | POA: Diagnosis not present

## 2016-04-04 DIAGNOSIS — L570 Actinic keratosis: Secondary | ICD-10-CM | POA: Diagnosis not present

## 2016-04-04 DIAGNOSIS — D1801 Hemangioma of skin and subcutaneous tissue: Secondary | ICD-10-CM | POA: Diagnosis not present

## 2016-04-04 DIAGNOSIS — L814 Other melanin hyperpigmentation: Secondary | ICD-10-CM | POA: Diagnosis not present

## 2016-04-04 DIAGNOSIS — Z85828 Personal history of other malignant neoplasm of skin: Secondary | ICD-10-CM | POA: Diagnosis not present

## 2016-08-19 ENCOUNTER — Other Ambulatory Visit: Payer: 59

## 2016-08-19 DIAGNOSIS — B20 Human immunodeficiency virus [HIV] disease: Secondary | ICD-10-CM

## 2016-08-19 DIAGNOSIS — Z79899 Other long term (current) drug therapy: Secondary | ICD-10-CM

## 2016-08-19 DIAGNOSIS — Z21 Asymptomatic human immunodeficiency virus [HIV] infection status: Secondary | ICD-10-CM

## 2016-08-19 DIAGNOSIS — Z113 Encounter for screening for infections with a predominantly sexual mode of transmission: Secondary | ICD-10-CM

## 2016-08-19 LAB — COMPLETE METABOLIC PANEL WITH GFR
ALBUMIN: 4.3 g/dL (ref 3.6–5.1)
ALK PHOS: 63 U/L (ref 40–115)
ALT: 14 U/L (ref 9–46)
AST: 16 U/L (ref 10–35)
BILIRUBIN TOTAL: 0.8 mg/dL (ref 0.2–1.2)
BUN: 16 mg/dL (ref 7–25)
CALCIUM: 9.4 mg/dL (ref 8.6–10.3)
CO2: 24 mmol/L (ref 20–31)
Chloride: 109 mmol/L (ref 98–110)
Creat: 1.28 mg/dL (ref 0.70–1.33)
GFR, EST AFRICAN AMERICAN: 73 mL/min (ref >=60)
GFR, EST NON AFRICAN AMERICAN: 63 mL/min (ref >=60)
Glucose, Bld: 87 mg/dL (ref 65–99)
POTASSIUM: 4 mmol/L (ref 3.5–5.3)
SODIUM: 142 mmol/L (ref 135–146)
Total Protein: 6.7 g/dL (ref 6.1–8.1)

## 2016-08-19 LAB — LIPID PANEL
Cholesterol: 224 mg/dL — ABNORMAL HIGH (ref <200)
HDL: 44 mg/dL (ref >40)
LDL CALC: 152 mg/dL — AB (ref <100)
TRIGLYCERIDES: 140 mg/dL (ref <150)
Total CHOL/HDL Ratio: 5.1 Ratio — ABNORMAL HIGH (ref <5.0)
VLDL: 28 mg/dL (ref <30)

## 2016-08-19 LAB — CBC WITH DIFFERENTIAL/PLATELET
Basophils Absolute: 56 cells/uL (ref 0–200)
Basophils Relative: 1 %
EOS PCT: 11 %
Eosinophils Absolute: 616 cells/uL — ABNORMAL HIGH (ref 15–500)
HCT: 45.1 % (ref 38.5–50.0)
HEMOGLOBIN: 15.5 g/dL (ref 13.2–17.1)
Lymphocytes Relative: 31 %
Lymphs Abs: 1736 cells/uL (ref 850–3900)
MCH: 32 pg (ref 27.0–33.0)
MCHC: 34.4 g/dL (ref 32.0–36.0)
MCV: 93.2 fL (ref 80.0–100.0)
MONOS PCT: 5 %
MPV: 9.5 fL (ref 7.5–12.5)
Monocytes Absolute: 280 cells/uL (ref 200–950)
NEUTROS ABS: 2912 {cells}/uL (ref 1500–7800)
Neutrophils Relative %: 52 %
PLATELETS: 208 10*3/uL (ref 140–400)
RBC: 4.84 MIL/uL (ref 4.20–5.80)
RDW: 13.8 % (ref 11.0–15.0)
WBC: 5.6 10*3/uL (ref 3.8–10.8)

## 2016-08-20 ENCOUNTER — Other Ambulatory Visit: Payer: 59

## 2016-08-20 LAB — RPR

## 2016-08-20 LAB — T-HELPER CELL (CD4) - (RCID CLINIC ONLY)
CD4 T CELL HELPER: 40 % (ref 33–55)
CD4 T Cell Abs: 720 /uL (ref 400–2700)

## 2016-08-21 LAB — HIV-1 RNA QUANT-NO REFLEX-BLD
HIV 1 RNA Quant: <20 copies/mL
HIV-1 RNA Quant, Log: <1.3 Log copies/mL

## 2016-08-29 DIAGNOSIS — J45909 Unspecified asthma, uncomplicated: Secondary | ICD-10-CM | POA: Diagnosis not present

## 2016-08-29 DIAGNOSIS — G4700 Insomnia, unspecified: Secondary | ICD-10-CM | POA: Diagnosis not present

## 2016-08-29 DIAGNOSIS — Z0000 Encounter for general adult medical examination without abnormal findings: Secondary | ICD-10-CM | POA: Diagnosis not present

## 2016-08-29 DIAGNOSIS — Z125 Encounter for screening for malignant neoplasm of prostate: Secondary | ICD-10-CM | POA: Diagnosis not present

## 2016-09-03 ENCOUNTER — Encounter: Payer: Self-pay | Admitting: Internal Medicine

## 2016-09-03 ENCOUNTER — Ambulatory Visit (INDEPENDENT_AMBULATORY_CARE_PROVIDER_SITE_OTHER): Payer: 59 | Admitting: Internal Medicine

## 2016-09-03 VITALS — BP 147/92 | HR 56 | Temp 98.0°F | Wt 180.0 lb

## 2016-09-03 DIAGNOSIS — Z5181 Encounter for therapeutic drug level monitoring: Secondary | ICD-10-CM

## 2016-09-03 DIAGNOSIS — Z113 Encounter for screening for infections with a predominantly sexual mode of transmission: Secondary | ICD-10-CM | POA: Diagnosis not present

## 2016-09-03 DIAGNOSIS — B20 Human immunodeficiency virus [HIV] disease: Secondary | ICD-10-CM | POA: Diagnosis not present

## 2016-09-03 DIAGNOSIS — Z21 Asymptomatic human immunodeficiency virus [HIV] infection status: Secondary | ICD-10-CM

## 2016-09-03 DIAGNOSIS — Z79899 Other long term (current) drug therapy: Secondary | ICD-10-CM

## 2016-09-03 MED ORDER — BICTEGRAVIR-EMTRICITAB-TENOFOV 50-200-25 MG PO TABS: 1.0000 | ORAL_TABLET | Freq: Every day | ORAL | 11 refills | Status: DC

## 2016-09-03 NOTE — Assessment & Plan Note (Signed)
Creat, LFTs wnl.  

## 2016-09-03 NOTE — Assessment & Plan Note (Addendum)
Doing well. I discussed Biktarvy which he can take with topiramate, and will switch.  Follow up in 1 year.

## 2016-09-03 NOTE — Assessment & Plan Note (Signed)
Screened negative 

## 2016-09-03 NOTE — Progress Notes (Signed)
  Subjective:    Patient ID: Sean Lawrence, male    DOB: Jan 12, 1966, 51 y.o.   MRN: [id number]  HPI He comes in for routine followup.  He has been on Tivicay and Descovy and tolerates it well. He denies any missed doses. No weight loss or diarrhea. Only new issue is tendinitis after bumping his elbow into a wall.  No issues though with two pills.  Labs reviewed and CD4 720, viral load remains undetectable.   No associated nv.     Review of Systems  Constitutional: Negative for unexpected weight change.  Gastrointestinal: Negative for diarrhea.  Musculoskeletal: Negative for back pain.  Skin: Negative for rash.  Neurological: Negative for dizziness.       Objective:   Physical Exam  Constitutional: He appears well-developed and well-nourished. No distress.  HENT:  Mouth/Throat: No oropharyngeal exudate.  Eyes: Right eye exhibits no discharge. Left eye exhibits no discharge. No scleral icterus.  Cardiovascular: Normal rate, regular rhythm and normal heart sounds.   No murmur heard. Lymphadenopathy:    He has no cervical adenopathy.  Skin: Skin is warm and dry. No rash noted.    SH: no alcohol       Assessment & Plan:

## 2016-09-10 ENCOUNTER — Telehealth: Payer: Self-pay | Admitting: *Deleted

## 2016-09-10 NOTE — Telephone Encounter (Signed)
Call from patient stating that his pharmacy told him that [facility] needs a prior authorization. The insurance is asking if he ever tried and failed anything previously and the last office notes state that he tolerated the previous meds he was on. I will speak to Dr. Linus Salmons and see if there is anything to back up changing to [facility]. Patient was advised to continue his previous meds until we can find out if insurance will approve the biktarvy. Myrtis Hopping

## 2016-09-11 DIAGNOSIS — G43019 Migraine without aura, intractable, without status migrainosus: Secondary | ICD-10-CM | POA: Diagnosis not present

## 2016-09-11 DIAGNOSIS — G43719 Chronic migraine without aura, intractable, without status migrainosus: Secondary | ICD-10-CM | POA: Diagnosis not present

## 2016-09-11 DIAGNOSIS — G43111 Migraine with aura, intractable, with status migrainosus: Secondary | ICD-10-CM | POA: Diagnosis not present

## 2016-09-15 ENCOUNTER — Other Ambulatory Visit: Payer: Self-pay | Admitting: Internal Medicine

## 2016-09-15 DIAGNOSIS — B20 Human immunodeficiency virus [HIV] disease: Secondary | ICD-10-CM

## 2016-09-15 MED ORDER — EMTRICITABINE-TENOFOVIR AF 200-25 MG PO TABS: 1.0000 | ORAL_TABLET | Freq: Every day | ORAL | 3 refills | Status: DC

## 2016-09-15 MED ORDER — DOLUTEGRAVIR SODIUM 50 MG PO TABS: 50.0000 mg | ORAL_TABLET | Freq: Every day | ORAL | 3 refills | Status: DC

## 2016-09-15 NOTE — Telephone Encounter (Signed)
No, we will just try again next year.  Will send refills of Tivicay and descovy

## 2016-09-15 NOTE — Telephone Encounter (Signed)
Patient notified. Jacqueline Cockerham CMA  

## 2016-12-27 DIAGNOSIS — Z23 Encounter for immunization: Secondary | ICD-10-CM | POA: Diagnosis not present

## 2017-03-10 DIAGNOSIS — G43019 Migraine without aura, intractable, without status migrainosus: Secondary | ICD-10-CM | POA: Diagnosis not present

## 2017-03-10 DIAGNOSIS — G43111 Migraine with aura, intractable, with status migrainosus: Secondary | ICD-10-CM | POA: Diagnosis not present

## 2017-03-10 DIAGNOSIS — G43719 Chronic migraine without aura, intractable, without status migrainosus: Secondary | ICD-10-CM | POA: Diagnosis not present

## 2017-03-23 DIAGNOSIS — G43719 Chronic migraine without aura, intractable, without status migrainosus: Secondary | ICD-10-CM | POA: Diagnosis not present

## 2017-03-23 DIAGNOSIS — G43111 Migraine with aura, intractable, with status migrainosus: Secondary | ICD-10-CM | POA: Diagnosis not present

## 2017-03-23 DIAGNOSIS — G43019 Migraine without aura, intractable, without status migrainosus: Secondary | ICD-10-CM | POA: Diagnosis not present

## 2017-04-08 DIAGNOSIS — L570 Actinic keratosis: Secondary | ICD-10-CM | POA: Diagnosis not present

## 2017-04-08 DIAGNOSIS — C44612 Basal cell carcinoma of skin of right upper limb, including shoulder: Secondary | ICD-10-CM | POA: Diagnosis not present

## 2017-04-08 DIAGNOSIS — L573 Poikiloderma of Civatte: Secondary | ICD-10-CM | POA: Diagnosis not present

## 2017-04-08 DIAGNOSIS — D485 Neoplasm of uncertain behavior of skin: Secondary | ICD-10-CM | POA: Diagnosis not present

## 2017-04-08 DIAGNOSIS — L814 Other melanin hyperpigmentation: Secondary | ICD-10-CM | POA: Diagnosis not present

## 2017-04-08 DIAGNOSIS — D225 Melanocytic nevi of trunk: Secondary | ICD-10-CM | POA: Diagnosis not present

## 2017-04-08 DIAGNOSIS — Z85828 Personal history of other malignant neoplasm of skin: Secondary | ICD-10-CM | POA: Diagnosis not present

## 2017-04-28 DIAGNOSIS — C44519 Basal cell carcinoma of skin of other part of trunk: Secondary | ICD-10-CM | POA: Diagnosis not present

## 2017-04-28 DIAGNOSIS — L570 Actinic keratosis: Secondary | ICD-10-CM | POA: Diagnosis not present

## 2017-04-29 DIAGNOSIS — H524 Presbyopia: Secondary | ICD-10-CM | POA: Diagnosis not present

## 2017-04-29 DIAGNOSIS — H35363 Drusen (degenerative) of macula, bilateral: Secondary | ICD-10-CM | POA: Diagnosis not present

## 2017-04-29 DIAGNOSIS — H25013 Cortical age-related cataract, bilateral: Secondary | ICD-10-CM | POA: Diagnosis not present

## 2017-05-05 DIAGNOSIS — G43019 Migraine without aura, intractable, without status migrainosus: Secondary | ICD-10-CM | POA: Diagnosis not present

## 2017-05-05 DIAGNOSIS — G43719 Chronic migraine without aura, intractable, without status migrainosus: Secondary | ICD-10-CM | POA: Diagnosis not present

## 2017-05-05 DIAGNOSIS — G43111 Migraine with aura, intractable, with status migrainosus: Secondary | ICD-10-CM | POA: Diagnosis not present

## 2017-08-04 ENCOUNTER — Encounter: Payer: Self-pay | Admitting: Internal Medicine

## 2017-08-12 ENCOUNTER — Encounter: Payer: Self-pay | Admitting: Internal Medicine

## 2017-08-17 ENCOUNTER — Ambulatory Visit: Payer: 59 | Admitting: Internal Medicine

## 2017-08-17 ENCOUNTER — Other Ambulatory Visit (HOSPITAL_COMMUNITY)
Admission: RE | Admit: 2017-08-17 | Discharge: 2017-08-17 | Disposition: A | Discharge status: Home / Self Care | Payer: 59 | Source: Ambulatory Visit | Attending: Internal Medicine | Admitting: Internal Medicine

## 2017-08-17 ENCOUNTER — Other Ambulatory Visit: Payer: Self-pay

## 2017-08-17 ENCOUNTER — Encounter: Payer: Self-pay | Admitting: Internal Medicine

## 2017-08-17 VITALS — BP 125/87 | HR 66 | Temp 97.8°F | Ht 71.0 in | Wt 177.0 lb

## 2017-08-17 DIAGNOSIS — Z79899 Other long term (current) drug therapy: Secondary | ICD-10-CM | POA: Diagnosis not present

## 2017-08-17 DIAGNOSIS — Z23 Encounter for immunization: Secondary | ICD-10-CM | POA: Diagnosis not present

## 2017-08-17 DIAGNOSIS — Z113 Encounter for screening for infections with a predominantly sexual mode of transmission: Secondary | ICD-10-CM

## 2017-08-17 DIAGNOSIS — Z21 Asymptomatic human immunodeficiency virus [HIV] infection status: Secondary | ICD-10-CM

## 2017-08-17 NOTE — Assessment & Plan Note (Signed)
Prevnar given today Menveo #1 and he will return in 2 months for #2

## 2017-08-17 NOTE — Assessment & Plan Note (Signed)
Doing great.  Labs today to confirm and rtc 1 year.

## 2017-08-17 NOTE — Assessment & Plan Note (Signed)
Will check lipid panel. 

## 2017-08-17 NOTE — Assessment & Plan Note (Signed)
Will screen today 

## 2017-08-17 NOTE — Progress Notes (Signed)
   Subjective:    Patient ID: Sean Lawrence, male    DOB: 1965-07-13, 51 y.o.   MRN: [id number]  HPI Here for follow up of HIV He continues to do great and no new issues.  Takes daily with no missed doses.  On tivicay and descovy as Biktarvy was not covered last year.  No new concerns.  No associated n/v/d.  No new issues.  No labs prior to this visit.    Review of Systems  Constitutional: Negative for fatigue.  Gastrointestinal: Negative for diarrhea.  Skin: Negative for rash.       Objective:   Physical Exam  Constitutional: He appears well-developed and well-nourished. No distress.  HENT:  Mouth/Throat: No oropharyngeal exudate.  Eyes: No scleral icterus.  Cardiovascular: Normal rate, regular rhythm and normal heart sounds.  No murmur heard. Pulmonary/Chest: Effort normal and breath sounds normal. No respiratory distress.  Skin: No rash noted.          Assessment & Plan:

## 2017-08-18 LAB — URINE CYTOLOGY ANCILLARY ONLY
CHLAMYDIA, DNA PROBE: NEGATIVE
Neisseria Gonorrhea: NEGATIVE

## 2017-08-18 LAB — CBC WITH DIFFERENTIAL/PLATELET
BASOS ABS: 68 {cells}/uL (ref 0–200)
Basophils Relative: 1.2 %
EOS ABS: 319 {cells}/uL (ref 15–500)
Eosinophils Relative: 5.6 %
HEMATOCRIT: 45.6 % (ref 38.5–50.0)
Hemoglobin: 15.6 g/dL (ref 13.2–17.1)
LYMPHS ABS: 2234 {cells}/uL (ref 850–3900)
MCH: 31.5 pg (ref 27.0–33.0)
MCHC: 34.2 g/dL (ref 32.0–36.0)
MCV: 91.9 fL (ref 80.0–100.0)
MPV: 10 fL (ref 7.5–12.5)
Monocytes Relative: 5.9 %
NEUTROS PCT: 48.1 %
Neutro Abs: 2742 cells/uL (ref 1500–7800)
PLATELETS: 222 10*3/uL (ref 140–400)
RBC: 4.96 10*6/uL (ref 4.20–5.80)
RDW: 12.9 % (ref 11.0–15.0)
TOTAL LYMPHOCYTE: 39.2 %
WBC: 5.7 10*3/uL (ref 3.8–10.8)
WBCMIX: 336 {cells}/uL (ref 200–950)

## 2017-08-18 LAB — COMPLETE METABOLIC PANEL WITH GFR
AG RATIO: 1.8 (calc) (ref 1.0–2.5)
ALT: 12 U/L (ref 9–46)
AST: 14 U/L (ref 10–35)
Albumin: 4.4 g/dL (ref 3.6–5.1)
Alkaline phosphatase (APISO): 67 U/L (ref 40–115)
BILIRUBIN TOTAL: 0.8 mg/dL (ref 0.2–1.2)
BUN: 15 mg/dL (ref 7–25)
CALCIUM: 9.4 mg/dL (ref 8.6–10.3)
CHLORIDE: 106 mmol/L (ref 98–110)
CO2: 26 mmol/L (ref 20–32)
Creat: 1.17 mg/dL (ref 0.70–1.33)
GFR, EST AFRICAN AMERICAN: 81 mL/min/{1.73_m2} (ref >=60)
GFR, EST NON AFRICAN AMERICAN: 70 mL/min/{1.73_m2} (ref >=60)
GLOBULIN: 2.5 g/dL (ref 1.9–3.7)
Glucose, Bld: 89 mg/dL (ref 65–99)
POTASSIUM: 3.9 mmol/L (ref 3.5–5.3)
SODIUM: 140 mmol/L (ref 135–146)
TOTAL PROTEIN: 6.9 g/dL (ref 6.1–8.1)

## 2017-08-18 LAB — T-HELPER CELL (CD4) - (RCID CLINIC ONLY)
CD4 T CELL HELPER: 43 % (ref 33–55)
CD4 T Cell Abs: 860 /uL (ref 400–2700)

## 2017-08-18 LAB — LIPID PANEL
Cholesterol: 204 mg/dL — ABNORMAL HIGH (ref <200)
HDL: 40 mg/dL — ABNORMAL LOW (ref >40)
LDL Cholesterol (Calc): 135 mg/dL (calc) — ABNORMAL HIGH
NON-HDL CHOLESTEROL (CALC): 164 mg/dL — AB (ref <130)
Total CHOL/HDL Ratio: 5.1 (calc) — ABNORMAL HIGH (ref <5.0)
Triglycerides: 154 mg/dL — ABNORMAL HIGH (ref <150)

## 2017-08-18 LAB — RPR: RPR: NONREACTIVE

## 2017-08-19 LAB — HIV-1 RNA QUANT-NO REFLEX-BLD
HIV 1 RNA Quant: 35 copies/mL — ABNORMAL HIGH
HIV-1 RNA Quant, Log: 1.54 Log copies/mL — ABNORMAL HIGH

## 2017-08-24 ENCOUNTER — Other Ambulatory Visit: Payer: Self-pay | Admitting: Internal Medicine

## 2017-08-24 DIAGNOSIS — B20 Human immunodeficiency virus [HIV] disease: Secondary | ICD-10-CM

## 2017-09-07 DIAGNOSIS — R21 Rash and other nonspecific skin eruption: Secondary | ICD-10-CM | POA: Diagnosis not present

## 2017-09-16 DIAGNOSIS — Z0000 Encounter for general adult medical examination without abnormal findings: Secondary | ICD-10-CM | POA: Diagnosis not present

## 2017-09-16 DIAGNOSIS — G4700 Insomnia, unspecified: Secondary | ICD-10-CM | POA: Diagnosis not present

## 2017-09-21 ENCOUNTER — Other Ambulatory Visit: Payer: Self-pay | Admitting: Internal Medicine

## 2017-09-21 DIAGNOSIS — B20 Human immunodeficiency virus [HIV] disease: Secondary | ICD-10-CM

## 2017-10-19 ENCOUNTER — Ambulatory Visit (INDEPENDENT_AMBULATORY_CARE_PROVIDER_SITE_OTHER): Payer: 59 | Admitting: *Deleted

## 2017-10-19 DIAGNOSIS — B20 Human immunodeficiency virus [HIV] disease: Secondary | ICD-10-CM | POA: Diagnosis not present

## 2017-10-19 DIAGNOSIS — Z23 Encounter for immunization: Secondary | ICD-10-CM

## 2017-11-03 DIAGNOSIS — G43719 Chronic migraine without aura, intractable, without status migrainosus: Secondary | ICD-10-CM | POA: Diagnosis not present

## 2017-11-03 DIAGNOSIS — G43019 Migraine without aura, intractable, without status migrainosus: Secondary | ICD-10-CM | POA: Diagnosis not present

## 2017-11-03 DIAGNOSIS — G43111 Migraine with aura, intractable, with status migrainosus: Secondary | ICD-10-CM | POA: Diagnosis not present

## 2017-12-18 DIAGNOSIS — E7800 Pure hypercholesterolemia, unspecified: Secondary | ICD-10-CM | POA: Diagnosis not present

## 2018-01-06 ENCOUNTER — Other Ambulatory Visit: Payer: Self-pay | Admitting: Internal Medicine

## 2018-01-06 MED ORDER — BICTEGRAVIR-EMTRICITAB-TENOFOV 50-200-25 MG PO TABS: 1.0000 | ORAL_TABLET | Freq: Every day | ORAL | 11 refills | Status: DC

## 2018-04-24 DIAGNOSIS — L03119 Cellulitis of unspecified part of limb: Secondary | ICD-10-CM | POA: Diagnosis not present

## 2018-05-02 DIAGNOSIS — L03115 Cellulitis of right lower limb: Secondary | ICD-10-CM | POA: Diagnosis not present

## 2018-05-06 DIAGNOSIS — G43019 Migraine without aura, intractable, without status migrainosus: Secondary | ICD-10-CM | POA: Diagnosis not present

## 2018-05-06 DIAGNOSIS — G43111 Migraine with aura, intractable, with status migrainosus: Secondary | ICD-10-CM | POA: Diagnosis not present

## 2018-05-06 DIAGNOSIS — G43719 Chronic migraine without aura, intractable, without status migrainosus: Secondary | ICD-10-CM | POA: Diagnosis not present

## 2018-07-23 ENCOUNTER — Other Ambulatory Visit: Payer: Self-pay

## 2018-07-23 DIAGNOSIS — Z20822 Contact with and (suspected) exposure to covid-19: Secondary | ICD-10-CM

## 2018-07-26 LAB — NOVEL CORONAVIRUS, NAA: SARS-CoV-2, NAA: NOT DETECTED

## 2018-08-04 ENCOUNTER — Other Ambulatory Visit: Payer: Self-pay

## 2018-08-04 ENCOUNTER — Other Ambulatory Visit: Payer: 59

## 2018-08-04 DIAGNOSIS — Z79899 Other long term (current) drug therapy: Secondary | ICD-10-CM

## 2018-08-04 DIAGNOSIS — Z113 Encounter for screening for infections with a predominantly sexual mode of transmission: Secondary | ICD-10-CM

## 2018-08-04 DIAGNOSIS — Z21 Asymptomatic human immunodeficiency virus [HIV] infection status: Secondary | ICD-10-CM

## 2018-08-05 LAB — T-HELPER CELL (CD4) - (RCID CLINIC ONLY)
CD4 % Helper T Cell: 41 % (ref 33–65)
CD4 T Cell Abs: 744 /uL (ref 400–1790)

## 2018-08-12 LAB — RPR: RPR Ser Ql: NONREACTIVE

## 2018-08-12 LAB — COMPLETE METABOLIC PANEL WITH GFR
AG Ratio: 2.2 (calc) (ref 1.0–2.5)
ALT: 22 U/L (ref 9–46)
AST: 22 U/L (ref 10–35)
Albumin: 4.4 g/dL (ref 3.6–5.1)
Alkaline phosphatase (APISO): 56 U/L (ref 35–144)
BUN: 16 mg/dL (ref 7–25)
CO2: 28 mmol/L (ref 20–32)
Calcium: 9.1 mg/dL (ref 8.6–10.3)
Chloride: 105 mmol/L (ref 98–110)
Creat: 1.07 mg/dL (ref 0.70–1.33)
GFR, Est African American: 90 mL/min/{1.73_m2} (ref >=60)
GFR, Est Non African American: 78 mL/min/{1.73_m2} (ref >=60)
Globulin: 2 g/dL (calc) (ref 1.9–3.7)
Glucose, Bld: 91 mg/dL (ref 65–99)
Potassium: 4.1 mmol/L (ref 3.5–5.3)
Sodium: 140 mmol/L (ref 135–146)
Total Bilirubin: 1.2 mg/dL (ref 0.2–1.2)
Total Protein: 6.4 g/dL (ref 6.1–8.1)

## 2018-08-12 LAB — CBC WITH DIFFERENTIAL/PLATELET
Absolute Monocytes: 512 cells/uL (ref 200–950)
Basophils Absolute: 50 cells/uL (ref 0–200)
Basophils Relative: 0.6 %
Eosinophils Absolute: 554 cells/uL — ABNORMAL HIGH (ref 15–500)
Eosinophils Relative: 6.6 %
HCT: 45.9 % (ref 38.5–50.0)
Hemoglobin: 15.8 g/dL (ref 13.2–17.1)
Lymphs Abs: 1831 cells/uL (ref 850–3900)
MCH: 31.4 pg (ref 27.0–33.0)
MCHC: 34.4 g/dL (ref 32.0–36.0)
MCV: 91.3 fL (ref 80.0–100.0)
MPV: 10.1 fL (ref 7.5–12.5)
Monocytes Relative: 6.1 %
Neutro Abs: 5452 cells/uL (ref 1500–7800)
Neutrophils Relative %: 64.9 %
Platelets: 203 10*3/uL (ref 140–400)
RBC: 5.03 10*6/uL (ref 4.20–5.80)
RDW: 12.7 % (ref 11.0–15.0)
Total Lymphocyte: 21.8 %
WBC: 8.4 10*3/uL (ref 3.8–10.8)

## 2018-08-12 LAB — LIPID PANEL
Cholesterol: 154 mg/dL (ref <200)
HDL: 49 mg/dL (ref >=40)
LDL Cholesterol (Calc): 86 mg/dL (calc)
Non-HDL Cholesterol (Calc): 105 mg/dL (calc) (ref <130)
Total CHOL/HDL Ratio: 3.1 (calc) (ref <5.0)
Triglycerides: 94 mg/dL (ref <150)

## 2018-08-12 LAB — HIV-1 RNA QUANT-NO REFLEX-BLD
HIV 1 RNA Quant: <20 copies/mL
HIV-1 RNA Quant, Log: <1.3 Log copies/mL

## 2018-08-17 ENCOUNTER — Telehealth: Payer: Self-pay | Admitting: Internal Medicine

## 2018-08-17 NOTE — Telephone Encounter (Signed)
COVID-19 Pre-Screening Questions:08/17/18   Do you currently have a fever (>100 F), chills or unexplained body aches? NO Are you currently experiencing new cough, shortness of breath, sore throat, runny nose? NO   Have you recently travelled outside the state of [place] in the last 14 days? NO   Have you been in contact with someone that is currently pending confirmation of Covid19 testing or has been confirmed to have the [place] virus? NO  **If the patient answers NO to ALL questions -  advise the patient to please call the clinic before coming to the office should any symptoms develop.

## 2018-08-18 ENCOUNTER — Ambulatory Visit: Payer: 59 | Admitting: Internal Medicine

## 2018-08-18 ENCOUNTER — Other Ambulatory Visit: Payer: Self-pay

## 2018-08-18 ENCOUNTER — Encounter: Payer: Self-pay | Admitting: Internal Medicine

## 2018-08-18 VITALS — BP 153/100 | HR 67 | Temp 98.2°F | Wt 184.0 lb

## 2018-08-18 DIAGNOSIS — Z21 Asymptomatic human immunodeficiency virus [HIV] infection status: Secondary | ICD-10-CM | POA: Diagnosis not present

## 2018-08-18 DIAGNOSIS — Z79899 Other long term (current) drug therapy: Secondary | ICD-10-CM | POA: Diagnosis not present

## 2018-08-18 DIAGNOSIS — I10 Essential (primary) hypertension: Secondary | ICD-10-CM | POA: Insufficient documentation

## 2018-08-18 DIAGNOSIS — Z113 Encounter for screening for infections with a predominantly sexual mode of transmission: Secondary | ICD-10-CM | POA: Diagnosis not present

## 2018-08-18 DIAGNOSIS — E7800 Pure hypercholesterolemia, unspecified: Secondary | ICD-10-CM | POA: Diagnosis not present

## 2018-08-18 NOTE — Assessment & Plan Note (Signed)
Screened negative 

## 2018-08-18 NOTE — Assessment & Plan Note (Signed)
Not typically up.  He will monitor.

## 2018-08-18 NOTE — Assessment & Plan Note (Signed)
Now on therapy and at goal

## 2018-08-18 NOTE — Assessment & Plan Note (Signed)
Doing great.  No issues.  rtc 1 year.

## 2018-08-18 NOTE — Progress Notes (Signed)
   Subjective:    Patient ID: Sean Lawrence, male    DOB: 1965/05/12, 53 y.o.   MRN: [id number]  HPI Here for follow up of HIV He continues to do great and no new issues.  Takes daily with no missed doses.  On [place] which is now covered for him.  No new concerns.  No associated n/v/d.  BP was up today, not typically up.  Now on statin and cholesterol improved.    Review of Systems  Constitutional: Negative for fatigue.  Gastrointestinal: Negative for diarrhea.  Skin: Negative for rash.       Objective:   Physical Exam Constitutional:      General: He is not in acute distress.    Appearance: He is well-developed.  Eyes:     General: No scleral icterus. Skin:    Findings: No rash.  Neurological:     General: No focal deficit present.  Psychiatric:        Mood and Affect: Mood normal.   SH: no tobacco        Assessment & Plan:

## 2018-11-23 ENCOUNTER — Other Ambulatory Visit: Payer: Self-pay | Admitting: Internal Medicine

## 2019-03-30 ENCOUNTER — Other Ambulatory Visit: Payer: Self-pay

## 2019-03-30 MED ORDER — BIKTARVY 50-200-25 MG PO TABS: 1.0000 | ORAL_TABLET | Freq: Every day | ORAL | 5 refills | Status: DC

## 2019-08-13 ENCOUNTER — Emergency Department (HOSPITAL_COMMUNITY): Payer: 59

## 2019-08-13 ENCOUNTER — Inpatient Hospital Stay (HOSPITAL_COMMUNITY)
Admission: EM | Admit: 2019-08-13 | Discharge: 2019-08-16 | DRG: 690 | Disposition: A | Discharge status: Home / Self Care | Payer: 59 | Attending: Internal Medicine | Admitting: Internal Medicine

## 2019-08-13 ENCOUNTER — Encounter (HOSPITAL_COMMUNITY): Payer: Self-pay

## 2019-08-13 DIAGNOSIS — N10 Acute tubulo-interstitial nephritis: Secondary | ICD-10-CM | POA: Diagnosis not present

## 2019-08-13 DIAGNOSIS — F419 Anxiety disorder, unspecified: Secondary | ICD-10-CM | POA: Diagnosis present

## 2019-08-13 DIAGNOSIS — B20 Human immunodeficiency virus [HIV] disease: Secondary | ICD-10-CM | POA: Diagnosis present

## 2019-08-13 DIAGNOSIS — E7800 Pure hypercholesterolemia, unspecified: Secondary | ICD-10-CM | POA: Diagnosis present

## 2019-08-13 DIAGNOSIS — Z882 Allergy status to sulfonamides status: Secondary | ICD-10-CM

## 2019-08-13 DIAGNOSIS — N12 Tubulo-interstitial nephritis, not specified as acute or chronic: Secondary | ICD-10-CM | POA: Diagnosis not present

## 2019-08-13 DIAGNOSIS — N419 Inflammatory disease of prostate, unspecified: Secondary | ICD-10-CM | POA: Diagnosis present

## 2019-08-13 DIAGNOSIS — K5730 Diverticulosis of large intestine without perforation or abscess without bleeding: Secondary | ICD-10-CM | POA: Diagnosis present

## 2019-08-13 DIAGNOSIS — G43909 Migraine, unspecified, not intractable, without status migrainosus: Secondary | ICD-10-CM | POA: Diagnosis present

## 2019-08-13 DIAGNOSIS — Z91038 Other insect allergy status: Secondary | ICD-10-CM

## 2019-08-13 DIAGNOSIS — Z21 Asymptomatic human immunodeficiency virus [HIV] infection status: Secondary | ICD-10-CM | POA: Diagnosis present

## 2019-08-13 DIAGNOSIS — Z79899 Other long term (current) drug therapy: Secondary | ICD-10-CM

## 2019-08-13 DIAGNOSIS — N3090 Cystitis, unspecified without hematuria: Secondary | ICD-10-CM | POA: Diagnosis present

## 2019-08-13 DIAGNOSIS — B009 Herpesviral infection, unspecified: Secondary | ICD-10-CM | POA: Diagnosis present

## 2019-08-13 DIAGNOSIS — Z20822 Contact with and (suspected) exposure to covid-19: Secondary | ICD-10-CM | POA: Diagnosis present

## 2019-08-13 DIAGNOSIS — Z7982 Long term (current) use of aspirin: Secondary | ICD-10-CM

## 2019-08-13 DIAGNOSIS — B9620 Unspecified Escherichia coli [E. coli] as the cause of diseases classified elsewhere: Secondary | ICD-10-CM | POA: Diagnosis present

## 2019-08-13 DIAGNOSIS — E785 Hyperlipidemia, unspecified: Secondary | ICD-10-CM | POA: Diagnosis present

## 2019-08-13 LAB — CBC WITH DIFFERENTIAL/PLATELET
Abs Immature Granulocytes: 0.09 10*3/uL — ABNORMAL HIGH (ref 0.00–0.07)
Basophils Absolute: 0.1 10*3/uL (ref 0.0–0.1)
Basophils Relative: 0 %
Eosinophils Absolute: 0.2 10*3/uL (ref 0.0–0.5)
Eosinophils Relative: 1 %
HCT: 43.7 % (ref 39.0–52.0)
Hemoglobin: 14.9 g/dL (ref 13.0–17.0)
Immature Granulocytes: 1 %
Lymphocytes Relative: 5 %
Lymphs Abs: 0.7 10*3/uL (ref 0.7–4.0)
MCH: 31.7 pg (ref 26.0–34.0)
MCHC: 34.1 g/dL (ref 30.0–36.0)
MCV: 93 fL (ref 80.0–100.0)
Monocytes Absolute: 0.7 10*3/uL (ref 0.1–1.0)
Monocytes Relative: 5 %
Neutro Abs: 12.4 10*3/uL — ABNORMAL HIGH (ref 1.7–7.7)
Neutrophils Relative %: 88 %
Platelets: 175 10*3/uL (ref 150–400)
RBC: 4.7 MIL/uL (ref 4.22–5.81)
RDW: 12.2 % (ref 11.5–15.5)
WBC: 14.1 10*3/uL — ABNORMAL HIGH (ref 4.0–10.5)
nRBC: 0 % (ref 0.0–0.2)

## 2019-08-13 LAB — COMPREHENSIVE METABOLIC PANEL
ALT: 32 U/L (ref 0–44)
AST: 38 U/L (ref 15–41)
Albumin: 4 g/dL (ref 3.5–5.0)
Alkaline Phosphatase: 58 U/L (ref 38–126)
Anion gap: 11 (ref 5–15)
BUN: 17 mg/dL (ref 6–20)
CO2: 22 mmol/L (ref 22–32)
Calcium: 9.1 mg/dL (ref 8.9–10.3)
Chloride: 107 mmol/L (ref 98–111)
Creatinine, Ser: 1.05 mg/dL (ref 0.61–1.24)
GFR calc Af Amer: >60 mL/min (ref >60)
GFR calc non Af Amer: >60 mL/min (ref >60)
Glucose, Bld: 174 mg/dL — ABNORMAL HIGH (ref 70–99)
Potassium: 3.5 mmol/L (ref 3.5–5.1)
Sodium: 140 mmol/L (ref 135–145)
Total Bilirubin: 1.5 mg/dL — ABNORMAL HIGH (ref 0.3–1.2)
Total Protein: 7.1 g/dL (ref 6.5–8.1)

## 2019-08-13 LAB — URINALYSIS, ROUTINE W REFLEX MICROSCOPIC
Bacteria, UA: NONE SEEN
Bilirubin Urine: NEGATIVE
Glucose, UA: NEGATIVE mg/dL
Ketones, ur: NEGATIVE mg/dL
Nitrite: NEGATIVE
Protein, ur: 100 mg/dL — AB
Specific Gravity, Urine: 1.026 (ref 1.005–1.030)
pH: 5 (ref 5.0–8.0)

## 2019-08-13 LAB — LACTIC ACID, PLASMA: Lactic Acid, Venous: 1.5 mmol/L (ref 0.5–1.9)

## 2019-08-13 MED ORDER — ACETAMINOPHEN 325 MG PO TABS
650.0000 mg | ORAL_TABLET | Freq: Once | ORAL | Status: AC
  Administered 2019-08-13: 650 mg via ORAL
  Filled 2019-08-13: qty 2

## 2019-08-13 MED ORDER — SODIUM CHLORIDE 0.9 % IV BOLUS
1000.0000 mL | Freq: Once | INTRAVENOUS | Status: AC
  Administered 2019-08-13: 1000 mL via INTRAVENOUS

## 2019-08-13 MED ORDER — SODIUM CHLORIDE 0.9% FLUSH
3.0000 mL | Freq: Once | INTRAVENOUS | Status: AC
  Administered 2019-08-14: 3 mL via INTRAVENOUS

## 2019-08-13 MED ORDER — SODIUM CHLORIDE 0.9 % IV SOLN
1.0000 g | Freq: Once | INTRAVENOUS | Status: AC
  Administered 2019-08-13: 1 g via INTRAVENOUS
  Filled 2019-08-13: qty 10

## 2019-08-13 NOTE — ED Provider Notes (Signed)
[facility] DEPT Provider Note   CSN: [id number] Arrival date & time: 08/13/19  2114     History Chief Complaint  Patient presents with  . Fever    Sean Lawrence is a 54 y.o. male with a history of HIV w/ last CD4 count of 744 & undetectable viral load, migraines, anxiety, & hypercholesterolemia who presents to the ED with complaints of fever that began today. Patient states that he has had intermittent lower back pain over the past few days, this AM woke up with increased urinary frequency & urgency, and later today developed fever. Lower back pain is bilateral, non-radiating, alleviated by tylenol (last taken around 1900) and ibuprofen (last taken around 2200). No other alleviating factors. Fever with temp max 104.7, has also had chills. Denies URI sxs, cough, dyspnea, chest pain, abdominal pain, N/V/D, dysuria, testicular pain/swelling, penile discharge, or rash. Has received both doses of his COVID 19 vaccine. His significant other at bedside states he was very flushed with onset of fever. Denies numbness, tingling, weakness, saddle anesthesia, incontinence to bowel/bladder, or hx of IV drug use.    HPI     Past Medical History:  Diagnosis Date  . H/O: vasectomy   . Headache(784.0)   . Hepatitis B   . Hernia, inguinal, right   . HIV infection ([place])   . Pruritus ani   . Rectal bleeding   . Wears glasses     Patient Active Problem List   Diagnosis Date Noted  . Hypercholesterolemia 08/18/2018  . High blood pressure 08/18/2018  . Medication monitoring encounter 09/03/2016  . Back pain 09/07/2014  . Screening examination for venereal disease 10/06/2013  . Encounter for long-term (current) use of medications 10/06/2013  . Pruritus ani 03/06/2011  . Hemorrhoids, internal 02/21/2011  . HIV (human immunodeficiency virus infection) ([place]) 08/30/2010  . Migraine 08/30/2010  . Anxiety 08/30/2010  . HSV (herpes simplex virus) infection 08/30/2010      Past Surgical History:  Procedure Laterality Date  . ANTERIOR CRUCIATE LIGAMENT REPAIR     left  . HERNIA REPAIR         Family History  Problem Relation Age of Onset  . Atrial fibrillation Mother   . Breast cancer Mother   . Cancer Mother        breast  . Acute myelogenous leukemia Father   . Cancer Father        leukemia  . Kidney disease Neg Hx     Social History   Tobacco Use  . Smoking status: Never Smoker  . Smokeless tobacco: Never Used  . Tobacco comment: patient does not smoke  Substance Use Topics  . Alcohol use: No    Alcohol/week: 0.0 standard drinks  . Drug use: No    Home Medications Prior to Admission medications   Medication Sig Start Date End Date Taking? Authorizing Provider  AIMOVIG 70 MG/ML SOAJ INJECT 70 MG BY SUBCUTANEOUS ROUTE ONCE A MONTH IN THE ABDOMEN, THIGH, OR OUTER AREA OF UPPER ARM 07/21/17   [provider]  aspirin-acetaminophen-caffeine (EXCEDRIN MIGRAINE) [phone] MG tablet Take 2 tablets by mouth every 6 (six) hours as needed for headache.    [provider]  atorvastatin (LIPITOR) 10 MG tablet Take 10 mg by mouth daily.    [provider]  bictegravir-emtricitabine-tenofovir AF (BIKTARVY) 50-200-25 MG TABS tablet Take 1 tablet by mouth daily. 03/30/19   Thayer Headings, MD  cholecalciferol (VITAMIN D) 1000 units tablet Take 2,000 Units  by mouth daily.    [provider]  EPINEPHrine (EPIPEN JR) 0.15 MG/0.3ML injection Inject 0.15 mg into the muscle as needed.      [provider]  Multiple Vitamins-Minerals (MULTIVITAMIN WITH MINERALS) tablet Take 1 tablet by mouth daily.      [provider]  QUEtiapine (SEROQUEL) 25 MG tablet Take 50 mg by mouth at bedtime.     [provider]  SUMAtriptan (IMITREX) 100 MG tablet Take 100 mg by mouth every 2 (two) hours as needed.    [provider]  topiramate (TOPAMAX) 200 MG tablet Take 200 mg by mouth at bedtime. 02/12/15    [provider]    Allergies    Wasp venom and Sulfa antibiotics  Review of Systems   Review of Systems  Constitutional: Positive for chills and fever.  HENT: Negative for congestion, ear pain and sore throat.   Respiratory: Negative for cough and shortness of breath.   Cardiovascular: Negative for chest pain.  Gastrointestinal: Negative for abdominal pain, blood in stool, diarrhea, nausea and vomiting.  Genitourinary: Positive for frequency and urgency. Negative for discharge, dysuria, penile swelling, scrotal swelling and testicular pain.  Musculoskeletal: Positive for back pain.  Neurological: Negative for weakness and numbness.       Negative for incontinence or saddle anesthesia.   All other systems reviewed and are negative.   Physical Exam Updated Vital Signs BP (!) 161/96 (BP Location: Right Arm)   Pulse (!) 106   Temp (!) 102.2 F (39 C) (Oral)   Resp 16   Ht 5\' 11"  (1.803 m)   Wt 82.1 kg   SpO2 97%   BMI 25.24 kg/m   Physical Exam Vitals and nursing note reviewed.  Constitutional:      General: He is not in acute distress.    Appearance: He is well-developed. He is not toxic-appearing.  HENT:     Head: Normocephalic and atraumatic.  Eyes:     General:        Right eye: No discharge.        Left eye: No discharge.     Conjunctiva/sclera: Conjunctivae normal.  Cardiovascular:     Rate and Rhythm: Regular rhythm. Tachycardia present.  Pulmonary:     Effort: Pulmonary effort is normal. No respiratory distress.     Breath sounds: Normal breath sounds. No wheezing, rhonchi or rales.  Abdominal:     General: There is no distension.     Palpations: Abdomen is soft.     Tenderness: There is no abdominal tenderness. There is no right CVA tenderness, left CVA tenderness, guarding or rebound.  Musculoskeletal:     Cervical back: Normal range of motion and neck supple. No spinous process tenderness or muscular tenderness.     Comments: No obvious  deformity, appreciable swelling, erythema, ecchymosis, significant open wounds, or increased warmth.  Extremities: Normal ROM. Nontender.  Back: No point/focal vertebral tenderness, no palpable step off or crepitus.   Skin:    General: Skin is warm and moist.     Findings: No rash.  Neurological:     Mental Status: He is alert.     Comments: Sensation grossly intact to bilateral lower extremities. 5/5 symmetric strength with plantar/dorsiflexion bilaterally. Gait is intact without obvious foot drop.   Psychiatric:        Behavior: Behavior normal.     ED Results / Procedures / Treatments   Labs (all labs ordered are listed, but only abnormal results are  displayed) Labs Reviewed  COMPREHENSIVE METABOLIC PANEL - Abnormal; Notable for the following components:      Result Value   Glucose, Bld 174 (*)    Total Bilirubin 1.5 (*)    All other components within normal limits  CBC WITH DIFFERENTIAL/PLATELET - Abnormal; Notable for the following components:   WBC 14.1 (*)    Neutro Abs 12.4 (*)    Abs Immature Granulocytes 0.09 (*)    All other components within normal limits  URINALYSIS, ROUTINE W REFLEX MICROSCOPIC - Abnormal; Notable for the following components:   APPearance HAZY (*)    Hgb urine dipstick MODERATE (*)    Protein, ur 100 (*)    Leukocytes,Ua LARGE (*)    All other components within normal limits  CULTURE, BLOOD (ROUTINE X 2)  CULTURE, BLOOD (ROUTINE X 2)  SARS CORONAVIRUS 2 BY RT PCR (HOSPITAL ORDER, [facility])  URINE CULTURE  URINE CULTURE  LACTIC ACID, PLASMA  LACTIC ACID, PLASMA  CBC  BASIC METABOLIC PANEL    EKG None  Radiology DG Chest 2 View  Result Date: 08/13/2019 CLINICAL DATA:  Fever, back pain starting yesterday EXAM: CHEST - 2 VIEW COMPARISON:  None FINDINGS: Mild airways thickening. No focal consolidation. No convincing features of edema, pneumothorax, or effusion. Pulmonary vascularity is normally distributed. The  cardiomediastinal contours are unremarkable. No acute osseous or soft tissue abnormality. IMPRESSION: Mild airways thickening could reflect bronchitis or reactive airways disease. No consolidative opacity. Electronically Signed   By: Lovena Le M.D.   On: 08/13/2019 21:57   CT Renal Stone Study  Result Date: 08/14/2019 CLINICAL DATA:  Flank pain, lower back pain began yesterday, now with fevers EXAM: CT ABDOMEN AND PELVIS WITHOUT CONTRAST TECHNIQUE: Multidetector CT imaging of the abdomen and pelvis was performed following the standard protocol without IV contrast. COMPARISON:  None. FINDINGS: Lower chest: Lung bases are clear. Normal heart size. No pericardial effusion. Hepatobiliary: No visible worrisome focal liver lesions. Smooth liver surface contour. Normal hepatic attenuation. Normal gallbladder and biliary tree without visible calcified gallstones. Pancreas: Unremarkable. No pancreatic ductal dilatation or surrounding inflammatory changes. Spleen: Normal in size without focal abnormality. Adrenals/Urinary Tract: Normal adrenal glands. Kidneys are normally located. There is slightly asymmetric left perinephric stranding predominantly about the upper pole. No visible contour deforming renal lesions. No urolithiasis or hydronephrosis. The urinary bladder is circumferentially thickened with extensive perivesicular hazy stranding. Stomach/Bowel: Distal esophagus, stomach and duodenal sweep are unremarkable. No small bowel wall thickening or dilatation. No evidence of obstruction. A normal appendix is visualized. No colonic dilatation or wall thickening. Scattered colonic diverticula without focal inflammation to suggest diverticulitis. There may be slight mild thickening of the rectum which is possibly reactive. Vascular/Lymphatic: No significant vascular findings are present. No enlarged abdominal or pelvic lymph nodes. Reproductive: Prostatomegaly. Some hazy stranding appears centered upon the prostate  and seminal vesicles as well. Other: No abdominopelvic free fluid or free gas. No bowel containing hernias. Inflammatory changes centered upon the bladder, prostate and seminal vesicles. Musculoskeletal: No acute osseous abnormality or suspicious osseous lesion. Multilevel degenerative changes are present in the imaged portions of the spine. Additional degenerative changes in the hips and pelvis. IMPRESSION: 1. Circumferentially thickened urinary bladder with extensive perivesicular hazy stranding, consistent with cystitis with some left perinephric stranding as well which could implicate an ascending tract infection. Correlate with urinalysis. 2. Some hazy stranding appears centered upon the prostate and seminal and minimally thickened rectum as well which could  reflect concomitant prostatitis and proctitis. 3. Colonic diverticulosis without evidence of diverticulitis. Electronically Signed   By: Lovena Le M.D.   On: 08/14/2019 00:38    Procedures Procedures (including critical care time)  Medications Ordered in ED Medications  sodium chloride flush (NS) 0.9 % injection 3 mL (has no administration in time range)  cefTRIAXone (ROCEPHIN) 1 g in sodium chloride 0.9 % 100 mL IVPB (1 g Intravenous New Bag/Given 08/13/19 2311)  acetaminophen (TYLENOL) tablet 650 mg (has no administration in time range)  sodium chloride 0.9 % bolus 1,000 mL (1,000 mLs Intravenous New Bag/Given 08/13/19 2311)    ED Course  I have reviewed the triage vital signs and the nursing notes.  Pertinent labs & imaging results that were available during my care of the patient were reviewed by me and considered in my medical decision making (see chart for details).    MDM Rules/Calculators/A&P                          Patient presents to the ED with complaints of fever. Nontoxic, vitals with fever & likely resultant tachycardia. Initial elevated BP improved. Exam is relatively benign. He is warm to the touch with moist skin.  No abdominal or CVA tenderness. No neuro deficits. Concern for sepsis given fever & tachycardia, will initiate abx & fluids, no 30 cc/kg fluid bolus ordered as patient is not hypotensive currently, will continue to monitor.   Additional history obtained:  Additional history obtained from patient's wife @ bedside. Previous records obtained and reviewed.   Lab Tests:  I Ordered, reviewed, and interpreted labs, which included:  CBC: Leukocytosis of 14.1 with left shift.  No anemia. CMP: Mildly elevated T bili.  Hyperglycemia without acidosis or anion gap elevation. Lactic acid: Within normal limits Urinalysis concerning for infection with large leukocytes, however no bacteria and nitrite negative.  He does have hematuria. Urine culture ordered.   Imaging Studies ordered:  I ordered imaging studies which included CT renal stone study, I independently visualized and interpreted imaging which showed 1. Circumferentially thickened urinary bladder with extensive perivesicular hazy stranding, consistent with cystitis with some left perinephric stranding as well which could implicate an ascending tract infection. Correlate with urinalysis. 2. Some hazy stranding appears centered upon the prostate and seminal and minimally thickened rectum as well which could reflect concomitant prostatitis and proctitis. 3. Colonic diverticulosis without evidence of diverticulitis.  In regards to prostate/rectal findings- patient without complaints of rectal pain, pain with BMs or diarrhea/mucous therefore feel prostatitis/colitis are somewhat less likely.   Will consult for admission for pyelonephritis. Patient & his significant other are  in agreement with this plan.   01:12: CONSULT: Discussed with hospitalist Dr. Alcario Drought- accepts admission.   Findings and plan of care discussed with supervising physician Dr. Zenia Resides who is in agreement.   Portions of this note were generated with [employer]. Dictation  errors may occur despite best attempts at proofreading.  Final Clinical Impression(s) / ED Diagnoses Final diagnoses:  Pyelonephritis    Rx / DC Orders ED Discharge Orders    None       Leafy Kindle 08/14/19 0123    Lacretia Leigh, MD 08/14/19 1550

## 2019-08-13 NOTE — ED Triage Notes (Addendum)
Pt reports lower back pain starting yesterday. States that today, he started having fevers. Reports his thermometer at home read 104.7. Pt has been alternating tylenol and ibuprofen all day. Lat dose of ibuprofen about 1 hour ago. Also reports increased urination. Denies hematuria or painful urination.   Pt reports that he is fully vaccinated against COVID 19.

## 2019-08-13 NOTE — ED Notes (Signed)
Pt has one set of blood cultures from [facility] in main lab

## 2019-08-14 ENCOUNTER — Other Ambulatory Visit: Payer: Self-pay

## 2019-08-14 ENCOUNTER — Emergency Department (HOSPITAL_COMMUNITY): Payer: 59

## 2019-08-14 DIAGNOSIS — Z91038 Other insect allergy status: Secondary | ICD-10-CM | POA: Diagnosis not present

## 2019-08-14 DIAGNOSIS — B9620 Unspecified Escherichia coli [E. coli] as the cause of diseases classified elsewhere: Secondary | ICD-10-CM | POA: Diagnosis present

## 2019-08-14 DIAGNOSIS — N390 Urinary tract infection, site not specified: Secondary | ICD-10-CM | POA: Diagnosis not present

## 2019-08-14 DIAGNOSIS — I10 Essential (primary) hypertension: Secondary | ICD-10-CM | POA: Diagnosis not present

## 2019-08-14 DIAGNOSIS — Z20822 Contact with and (suspected) exposure to covid-19: Secondary | ICD-10-CM | POA: Diagnosis present

## 2019-08-14 DIAGNOSIS — Z21 Asymptomatic human immunodeficiency virus [HIV] infection status: Secondary | ICD-10-CM | POA: Diagnosis not present

## 2019-08-14 DIAGNOSIS — Z79899 Other long term (current) drug therapy: Secondary | ICD-10-CM | POA: Diagnosis not present

## 2019-08-14 DIAGNOSIS — K5730 Diverticulosis of large intestine without perforation or abscess without bleeding: Secondary | ICD-10-CM | POA: Diagnosis present

## 2019-08-14 DIAGNOSIS — B009 Herpesviral infection, unspecified: Secondary | ICD-10-CM | POA: Diagnosis present

## 2019-08-14 DIAGNOSIS — N3090 Cystitis, unspecified without hematuria: Secondary | ICD-10-CM | POA: Diagnosis present

## 2019-08-14 DIAGNOSIS — N12 Tubulo-interstitial nephritis, not specified as acute or chronic: Secondary | ICD-10-CM | POA: Diagnosis present

## 2019-08-14 DIAGNOSIS — E7800 Pure hypercholesterolemia, unspecified: Secondary | ICD-10-CM | POA: Diagnosis present

## 2019-08-14 DIAGNOSIS — B20 Human immunodeficiency virus [HIV] disease: Secondary | ICD-10-CM | POA: Diagnosis present

## 2019-08-14 DIAGNOSIS — Z882 Allergy status to sulfonamides status: Secondary | ICD-10-CM | POA: Diagnosis not present

## 2019-08-14 DIAGNOSIS — Z7982 Long term (current) use of aspirin: Secondary | ICD-10-CM | POA: Diagnosis not present

## 2019-08-14 DIAGNOSIS — F419 Anxiety disorder, unspecified: Secondary | ICD-10-CM | POA: Diagnosis present

## 2019-08-14 DIAGNOSIS — N10 Acute tubulo-interstitial nephritis: Principal | ICD-10-CM

## 2019-08-14 DIAGNOSIS — N419 Inflammatory disease of prostate, unspecified: Secondary | ICD-10-CM | POA: Diagnosis present

## 2019-08-14 DIAGNOSIS — G43909 Migraine, unspecified, not intractable, without status migrainosus: Secondary | ICD-10-CM | POA: Diagnosis present

## 2019-08-14 DIAGNOSIS — B9629 Other Escherichia coli [E. coli] as the cause of diseases classified elsewhere: Secondary | ICD-10-CM | POA: Diagnosis not present

## 2019-08-14 DIAGNOSIS — E785 Hyperlipidemia, unspecified: Secondary | ICD-10-CM | POA: Diagnosis present

## 2019-08-14 LAB — CBC
HCT: 40.2 % (ref 39.0–52.0)
Hemoglobin: 13.7 g/dL (ref 13.0–17.0)
MCH: 31.4 pg (ref 26.0–34.0)
MCHC: 34.1 g/dL (ref 30.0–36.0)
MCV: 92.2 fL (ref 80.0–100.0)
Platelets: 132 10*3/uL — ABNORMAL LOW (ref 150–400)
RBC: 4.36 MIL/uL (ref 4.22–5.81)
RDW: 12.4 % (ref 11.5–15.5)
WBC: 14.7 10*3/uL — ABNORMAL HIGH (ref 4.0–10.5)
nRBC: 0 % (ref 0.0–0.2)

## 2019-08-14 LAB — BASIC METABOLIC PANEL
Anion gap: 8 (ref 5–15)
BUN: 14 mg/dL (ref 6–20)
CO2: 24 mmol/L (ref 22–32)
Calcium: 8.5 mg/dL — ABNORMAL LOW (ref 8.9–10.3)
Chloride: 107 mmol/L (ref 98–111)
Creatinine, Ser: 0.89 mg/dL (ref 0.61–1.24)
GFR calc Af Amer: >60 mL/min (ref >60)
GFR calc non Af Amer: >60 mL/min (ref >60)
Glucose, Bld: 130 mg/dL — ABNORMAL HIGH (ref 70–99)
Potassium: 3.7 mmol/L (ref 3.5–5.1)
Sodium: 139 mmol/L (ref 135–145)

## 2019-08-14 LAB — SARS CORONAVIRUS 2 BY RT PCR (HOSPITAL ORDER, PERFORMED IN ~~LOC~~ HOSPITAL LAB): SARS Coronavirus 2: NEGATIVE

## 2019-08-14 LAB — LACTIC ACID, PLASMA: Lactic Acid, Venous: 0.8 mmol/L (ref 0.5–1.9)

## 2019-08-14 MED ORDER — ENOXAPARIN SODIUM 40 MG/0.4ML ~~LOC~~ SOLN
40.0000 mg | SUBCUTANEOUS | Status: DC
  Administered 2019-08-14 – 2019-08-16 (×3): 40 mg via SUBCUTANEOUS
  Filled 2019-08-14 (×3): qty 0.4

## 2019-08-14 MED ORDER — SODIUM CHLORIDE 0.9 % IV SOLN: INTRAVENOUS | Status: DC

## 2019-08-14 MED ORDER — QUETIAPINE FUMARATE 50 MG PO TABS
50.0000 mg | ORAL_TABLET | Freq: Every day | ORAL | Status: DC
  Administered 2019-08-14 – 2019-08-15 (×3): 50 mg via ORAL
  Filled 2019-08-14 (×3): qty 1

## 2019-08-14 MED ORDER — IBUPROFEN 400 MG PO TABS
400.0000 mg | ORAL_TABLET | Freq: Four times a day (QID) | ORAL | Status: DC | PRN
  Administered 2019-08-14: 400 mg via ORAL
  Filled 2019-08-14: qty 2

## 2019-08-14 MED ORDER — ACETAMINOPHEN 325 MG PO TABS
650.0000 mg | ORAL_TABLET | Freq: Four times a day (QID) | ORAL | Status: DC | PRN
  Administered 2019-08-14 – 2019-08-15 (×4): 650 mg via ORAL
  Filled 2019-08-14 (×4): qty 2

## 2019-08-14 MED ORDER — ONDANSETRON HCL 4 MG/2ML IJ SOLN
4.0000 mg | Freq: Four times a day (QID) | INTRAMUSCULAR | Status: DC | PRN
  Administered 2019-08-14: 4 mg via INTRAVENOUS
  Filled 2019-08-14: qty 2

## 2019-08-14 MED ORDER — SODIUM CHLORIDE 0.9 % IV SOLN
1.0000 g | INTRAVENOUS | Status: DC
  Administered 2019-08-14: 1 g via INTRAVENOUS
  Filled 2019-08-14: qty 10

## 2019-08-14 MED ORDER — ADULT MULTIVITAMIN W/MINERALS CH
1.0000 | ORAL_TABLET | Freq: Every day | ORAL | Status: DC
  Administered 2019-08-14 – 2019-08-16 (×3): 1 via ORAL
  Filled 2019-08-14 (×3): qty 1

## 2019-08-14 MED ORDER — SUMATRIPTAN SUCCINATE 50 MG PO TABS
100.0000 mg | ORAL_TABLET | ORAL | Status: AC | PRN
  Administered 2019-08-14 – 2019-08-15 (×2): 100 mg via ORAL
  Filled 2019-08-14 (×3): qty 2

## 2019-08-14 MED ORDER — MORPHINE SULFATE (PF) 2 MG/ML IV SOLN
2.0000 mg | INTRAVENOUS | Status: DC | PRN
  Filled 2019-08-14: qty 1

## 2019-08-14 MED ORDER — ONDANSETRON HCL 4 MG PO TABS: 4.0000 mg | ORAL_TABLET | Freq: Four times a day (QID) | ORAL | Status: DC | PRN

## 2019-08-14 MED ORDER — ATORVASTATIN CALCIUM 10 MG PO TABS
10.0000 mg | ORAL_TABLET | Freq: Every day | ORAL | Status: DC
  Administered 2019-08-14 – 2019-08-16 (×3): 10 mg via ORAL
  Filled 2019-08-14 (×3): qty 1

## 2019-08-14 MED ORDER — BICTEGRAVIR-EMTRICITAB-TENOFOV 50-200-25 MG PO TABS
1.0000 | ORAL_TABLET | Freq: Every day | ORAL | Status: DC
  Administered 2019-08-14 – 2019-08-15 (×2): 1 via ORAL
  Filled 2019-08-14 (×2): qty 1

## 2019-08-14 MED ORDER — BICTEGRAVIR-EMTRICITAB-TENOFOV 50-200-25 MG PO TABS
1.0000 | ORAL_TABLET | Freq: Every day | ORAL | Status: DC
  Filled 2019-08-14: qty 1

## 2019-08-14 NOTE — Plan of Care (Signed)
  Problem: Education: Goal: Knowledge of General Education information will improve Description: Including pain rating scale, medication(s)/side effects and non-pharmacologic comfort measures Outcome: Progressing   Problem: Clinical Measurements: Goal: Will remain free from infection Outcome: Progressing   Problem: Nutrition: Goal: Adequate nutrition will be maintained Outcome: Progressing   Problem: Coping: Goal: Level of anxiety will decrease Outcome: Progressing   Problem: Pain Managment: Goal: General experience of comfort will improve Outcome: Progressing   

## 2019-08-14 NOTE — H&P (Signed)
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 male with medical history significant of HIV on HAART.  Pt presents to the ED with c/o Back pain and fever.  Back pain onset yesterday.  Today started having fevers up to 104.7 at home with dysuria.  No rectal pain.  Has had COVID vaccine.   ED Course: UA c/w UTI.  CT shows pyelonephritis, cystitis, and ? Prostatitis/proctitis.  Felt not to have prostatitis / proctitis given lack of rectal pain.  Started on rocephin for UTI / pyelonephritis.  Tm 102.2 in ED.  WBC 14.4k   Review of Systems: As per HPI, otherwise all review of systems negative.  Past Medical History:  Diagnosis Date  . H/O: vasectomy   . Headache(784.0)   . Hepatitis B   . Hernia, inguinal, right   . HIV infection ([place])   . Pruritus ani   . Rectal bleeding   . Wears glasses     Past Surgical History:  Procedure Laterality Date  . ANTERIOR CRUCIATE LIGAMENT REPAIR     left  . HERNIA REPAIR       reports that he has never smoked. He has never used smokeless tobacco. He reports that he does not drink alcohol and does not use drugs.  Allergies  Allergen Reactions  . Wasp Venom Anaphylaxis    Yellow Jackets only allergy   . Sulfa Antibiotics Other (See Comments)    Eye cream containing sulfa     Family History  Problem Relation Age of Onset  . Atrial fibrillation Mother   . Breast cancer Mother   . Cancer Mother        breast  . Acute myelogenous leukemia Father   . Cancer Father        leukemia  . Kidney disease Neg Hx      Prior to Admission medications   Medication Sig Start Date End Date Taking? Authorizing Provider  acetaminophen (TYLENOL) 325 MG tablet Take 650 mg by mouth every 6 (six)  hours as needed.   Yes [provider]  aspirin-acetaminophen-caffeine (EXCEDRIN MIGRAINE) [phone] MG tablet Take 2 tablets by mouth every 6 (six) hours as needed for headache.   Yes [provider]  atorvastatin (LIPITOR) 10 MG tablet Take 10 mg by mouth daily.   Yes [provider]  bictegravir-emtricitabine-tenofovir AF (BIKTARVY) 50-200-25 MG TABS tablet Take 1 tablet by mouth daily. 03/30/19  Yes Comer, Okey Regal, MD  EPINEPHrine (EPIPEN JR) 0.15 MG/0.3ML injection Inject 0.15 mg into the muscle as needed.     Yes [provider]  Multiple Vitamins-Minerals (MULTIVITAMIN WITH MINERALS) tablet Take 1 tablet by mouth daily.     Yes [provider]  QUEtiapine (SEROQUEL) 25 MG tablet Take 50 mg by mouth at bedtime.    Yes [provider]  SUMAtriptan (IMITREX) 100 MG tablet Take 100 mg by mouth every 2 (two) hours as needed.   Yes [provider]    Physical Exam: Vitals:   08/13/19 2122 08/13/19 2315  BP: (!) 161/96 136/85  Pulse: (!) 106 81  Resp: 16 (!) 21  Temp: (!) 102.2 F (39 C)   TempSrc: Oral   SpO2: 97% 98%  Weight: 82.1 kg  Height: 5\' 11"  (1.803 m)     Constitutional: NAD, calm, comfortable Eyes: PERRL, lids and conjunctivae normal ENMT: Mucous membranes are moist. Posterior pharynx clear of any exudate or lesions.Normal dentition.  Neck: normal, supple, no masses, no thyromegaly Respiratory: clear to auscultation bilaterally, no wheezing, no crackles. Normal respiratory effort. No accessory muscle use.  Cardiovascular: Regular rate and rhythm, no murmurs / rubs / gallops. No extremity edema. 2+ pedal pulses. No carotid bruits.  Abdomen: no tenderness, no masses palpated. No hepatosplenomegaly. Bowel sounds positive.  Musculoskeletal: no clubbing / cyanosis. No joint deformity upper and lower extremities. Good ROM, no contractures. Normal muscle tone.  Skin: no rashes, lesions, ulcers. No  induration Neurologic: CN 2-12 grossly intact. Sensation intact, DTR normal. Strength 5/5 in all 4.  Psychiatric: Normal judgment and insight. Alert and oriented x 3. Normal mood.    Labs on Admission: I have personally reviewed following labs and imaging studies  CBC: Recent Labs  Lab 08/13/19 2141  WBC 14.1*  NEUTROABS 12.4*  HGB 14.9  HCT 43.7  MCV 93.0  PLT 382   Basic Metabolic Panel: Recent Labs  Lab 08/13/19 2141  NA 140  K 3.5  CL 107  CO2 22  GLUCOSE 174*  BUN 17  CREATININE 1.05  CALCIUM 9.1   GFR: Estimated Creatinine Clearance: 83.7 mL/min (by C-G formula based on SCr of 1.05 mg/dL). Liver Function Tests: Recent Labs  Lab 08/13/19 2141  AST 38  ALT 32  ALKPHOS 58  BILITOT 1.5*  PROT 7.1  ALBUMIN 4.0   No results for input(s): LIPASE, AMYLASE in the last 168 hours. No results for input(s): AMMONIA in the last 168 hours. Coagulation Profile: No results for input(s): INR, PROTIME in the last 168 hours. Cardiac Enzymes: No results for input(s): CKTOTAL, CKMB, CKMBINDEX, TROPONINI in the last 168 hours. BNP (last 3 results) No results for input(s): PROBNP in the last 8760 hours. HbA1C: No results for input(s): HGBA1C in the last 72 hours. CBG: No results for input(s): GLUCAP in the last 168 hours. Lipid Profile: No results for input(s): CHOL, HDL, LDLCALC, TRIG, CHOLHDL, LDLDIRECT in the last 72 hours. Thyroid Function Tests: No results for input(s): TSH, T4TOTAL, FREET4, T3FREE, THYROIDAB in the last 72 hours. Anemia Panel: No results for input(s): VITAMINB12, FOLATE, FERRITIN, TIBC, IRON, RETICCTPCT in the last 72 hours. Urine analysis:    Component Value Date/Time   COLORURINE YELLOW 08/13/2019 2141   APPEARANCEUR HAZY (A) 08/13/2019 2141   LABSPEC 1.026 08/13/2019 2141   PHURINE 5.0 08/13/2019 2141   GLUCOSEU NEGATIVE 08/13/2019 2141   HGBUR MODERATE (A) 08/13/2019 2141   BILIRUBINUR NEGATIVE 08/13/2019 2141   KETONESUR NEGATIVE  08/13/2019 2141   PROTEINUR 100 (A) 08/13/2019 2141   UROBILINOGEN 0.2 09/18/2010 1232   NITRITE NEGATIVE 08/13/2019 2141   LEUKOCYTESUR LARGE (A) 08/13/2019 2141    Radiological Exams on Admission: DG Chest 2 View  Result Date: 08/13/2019 CLINICAL DATA:  Fever, back pain starting yesterday EXAM: CHEST - 2 VIEW COMPARISON:  None FINDINGS: Mild airways thickening. No focal consolidation. No convincing features of edema, pneumothorax, or effusion. Pulmonary vascularity is normally distributed. The cardiomediastinal contours are unremarkable. No acute osseous or soft tissue abnormality. IMPRESSION: Mild airways thickening could reflect bronchitis or reactive airways disease. No consolidative opacity. Electronically Signed   By: Lovena Le M.D.   On: 08/13/2019 21:57   CT Renal Stone Study  Result Date: 08/14/2019 CLINICAL DATA:  Flank pain, lower back pain began yesterday,  now with fevers EXAM: CT ABDOMEN AND PELVIS WITHOUT CONTRAST TECHNIQUE: Multidetector CT imaging of the abdomen and pelvis was performed following the standard protocol without IV contrast. COMPARISON:  None. FINDINGS: Lower chest: Lung bases are clear. Normal heart size. No pericardial effusion. Hepatobiliary: No visible worrisome focal liver lesions. Smooth liver surface contour. Normal hepatic attenuation. Normal gallbladder and biliary tree without visible calcified gallstones. Pancreas: Unremarkable. No pancreatic ductal dilatation or surrounding inflammatory changes. Spleen: Normal in size without focal abnormality. Adrenals/Urinary Tract: Normal adrenal glands. Kidneys are normally located. There is slightly asymmetric left perinephric stranding predominantly about the upper pole. No visible contour deforming renal lesions. No urolithiasis or hydronephrosis. The urinary bladder is circumferentially thickened with extensive perivesicular hazy stranding. Stomach/Bowel: Distal esophagus, stomach and duodenal sweep are  unremarkable. No small bowel wall thickening or dilatation. No evidence of obstruction. A normal appendix is visualized. No colonic dilatation or wall thickening. Scattered colonic diverticula without focal inflammation to suggest diverticulitis. There may be slight mild thickening of the rectum which is possibly reactive. Vascular/Lymphatic: No significant vascular findings are present. No enlarged abdominal or pelvic lymph nodes. Reproductive: Prostatomegaly. Some hazy stranding appears centered upon the prostate and seminal vesicles as well. Other: No abdominopelvic free fluid or free gas. No bowel containing hernias. Inflammatory changes centered upon the bladder, prostate and seminal vesicles. Musculoskeletal: No acute osseous abnormality or suspicious osseous lesion. Multilevel degenerative changes are present in the imaged portions of the spine. Additional degenerative changes in the hips and pelvis. IMPRESSION: 1. Circumferentially thickened urinary bladder with extensive perivesicular hazy stranding, consistent with cystitis with some left perinephric stranding as well which could implicate an ascending tract infection. Correlate with urinalysis. 2. Some hazy stranding appears centered upon the prostate and seminal and minimally thickened rectum as well which could reflect concomitant prostatitis and proctitis. 3. Colonic diverticulosis without evidence of diverticulitis. Electronically Signed   By: Lovena Le M.D.   On: 08/14/2019 00:38    EKG: Independently reviewed.  Assessment/Plan Principal Problem:   Acute pyelonephritis Active Problems:   HIV (human immunodeficiency virus infection) ([place])    1. Pyelonephritis - 1. Rocephin 2. UCx and BCx pending 3. zofran PRN nausea 4. Tylenol PRN fever 5. Morphine if needed for severe pain 2. HIV - 1. Asymptomatic HIV: undetectable viral load and normal CD4 count for years 2. Cont HAART 3. Is due for his 1 year follow up with ID  DVT  prophylaxis: Lovenox Code Status: Full Family Communication: No family in room Disposition Plan: Home after pyelonephritis treatment / improvement of fever and symptoms Consults called: None Admission status: Place in 53    Zakery Normington, [place] [facility]  How to contact the [facility] Attending or Consulting provider [place] or covering provider during after hours [place], for this patient?  1. Check the care team in [facility] and look for a) attending/consulting [facility] provider listed and b) the [facility] team listed 2. Log into [email or web address]  Amion Physician Scheduling and messaging for groups and whole hospitals  On call and physician scheduling software for group practices, residents, hospitalists and other medical providers for call, clinic, rotation and shift schedules. On[employer] is a hospital-wide system for scheduling doctors and paging doctors on call. EasyPlot is for scientific plotting and data analysis.  [email or web address]  and use [place]'s universal password to access. If you do not have the password, please contact the hospital operator.  3. Locate the [facility] provider you are looking for under  [facility] and page to a number that you can be directly reached. 4. If you still have difficulty reaching the provider, please page the [facility] (Director on Call) for the Hospitalists listed on amion for assistance.  08/14/2019, 1:26 AM

## 2019-08-14 NOTE — Progress Notes (Signed)
54 year old male with history of HIV with undetectable viral load and normal CD4 count on HAART follows up routinely with his ID admitted with fever of 104.7 at home with dysuria. UA large amount of leukocytes, negative nitrites and 21-50 WBC. CT scan-Circumferentially thickened urinary bladder with extensive perivesicular hazy stranding, consistent with cystitis with some left perinephric stranding as well which could implicate an ascending tract infection. Correlate with urinalysis. Some hazy stranding appears centered upon the prostate and seminal and minimally thickened rectum as well which could reflect concomitant prostatitis and proctitis.  Colonic diverticulosis without evidence of diverticulitis. White count 14.7 T-max 102.2 Urine culture pending Blood culture pending Patient continues with bilateral CVA tenderness. Continue IV Rocephin.

## 2019-08-15 MED ORDER — SODIUM CHLORIDE 0.9 % IV SOLN
2.0000 g | INTRAVENOUS | Status: DC
  Administered 2019-08-15: 2 g via INTRAVENOUS
  Filled 2019-08-15: qty 2

## 2019-08-15 MED ORDER — SUMATRIPTAN SUCCINATE 50 MG PO TABS
100.0000 mg | ORAL_TABLET | ORAL | Status: DC | PRN
  Administered 2019-08-15: 50 mg via ORAL
  Filled 2019-08-15: qty 2

## 2019-08-15 MED ORDER — PSYLLIUM 95 % PO PACK
1.0000 | PACK | Freq: Every day | ORAL | Status: DC
  Administered 2019-08-15 – 2019-08-16 (×2): 1 via ORAL
  Filled 2019-08-15 (×2): qty 1

## 2019-08-15 MED ORDER — ASPIRIN-ACETAMINOPHEN-CAFFEINE 250-250-65 MG PO TABS
2.0000 | ORAL_TABLET | Freq: Four times a day (QID) | ORAL | Status: DC | PRN
  Administered 2019-08-16: 2 via ORAL
  Filled 2019-08-15 (×3): qty 2

## 2019-08-15 MED ORDER — SUMATRIPTAN SUCCINATE 50 MG PO TABS
100.0000 mg | ORAL_TABLET | Freq: Every day | ORAL | Status: DC | PRN
  Filled 2019-08-15 (×2): qty 2

## 2019-08-15 NOTE — Progress Notes (Signed)
Dr. Lala Lund. Ramesh notified of patient's blood pressure of 152/100

## 2019-08-15 NOTE — Progress Notes (Signed)
PROGRESS NOTE    Sean CLAIRE  [name] DOB: March 03, 1965 DOA: 08/13/2019 PCP: Lujean Amel, MD   Chef Complaints: Back pain and fever  Brief Narrative: 54 year old male with HIV and HAART, history hepatitis B, headache, rectal bleeding comes to the ED for evaluation of back pain and fever that started 7/10,, fever up to 104.7 with dysuria. In the ED febrile UA consistent with UTI CT showed circumferentially thickened urinary bladder with extensive perivascular hazy stranding consistent with cystitis with left perinephric stranding which could implicate an ascending tract infection, hazy stranding appears centered upon the prostate and seminal and minimally thickened rectum as well which could reflect concomitant prostatitis and proctitis He was placed on ceftriaxone and and was admitted for further management  Subjective: Seen this morning ambulating well.  On room air.  No flank pain no nausea or vomiting.  No rectal pain dysuria. T-max 102.2 at 02 am today Hemodynamically stable. WBC count remains up at 14k  Assessment & Plan:  Acute pyelonephritis with possible concomitant prostatitis/proctitis: Urine culture with gram-negative rods blood culture no growth so far.  With ongoing intermittent fever otherwise hemodynamically stable.  Continue ceftriaxone at 2 g every 24 hours, follow-up culture data to de-escalate antibiotics   HIV on Biktarvy continue the same, stable viral load on last follow-up on 08/04/2018  HLD continue Lipitor  Uncontrolled blood pressure: BP running high no previous diagnosis of hypertension.  Will DC IV fluids monitor if needed we can initiate antihypertensive or have him follow-up with his PCP.  DVT prophylaxis: enoxaparin (LOVENOX) injection 40 mg Start: 08/14/19 1000 Code Status: full Family Communication: plan of care discussed with patient at bedside.  Status is: Inpatient  Remains inpatient appropriate because:IV treatments appropriate due to  intensity of illness or inability to take PO and Inpatient level of care appropriate due to severity of illness   Dispo: The patient is from: Home              Anticipated d/c is to: Home              Anticipated d/c date is: 2 days              Patient currently is not medically stable to d/c.   Nutrition: Diet Order            Diet regular Room service appropriate? Yes; Fluid consistency: Thin  Diet effective now                    Body mass index is 26.53 kg/m.  Consultants:see note  Procedures:see note Microbiology:see note  Medications: Scheduled Meds: . atorvastatin  10 mg Oral Daily  . bictegravir-emtricitabine-tenofovir AF  1 tablet Oral QHS  . enoxaparin (LOVENOX) injection  40 mg Subcutaneous Q24H  . multivitamin with minerals  1 tablet Oral Daily  . psyllium  1 packet Oral Daily  . QUEtiapine  50 mg Oral QHS   Continuous Infusions: . cefTRIAXone (ROCEPHIN)  IV      Antimicrobials: Anti-infectives (From admission, onward)   Start     Dose/Rate Route Frequency Ordered Stop   08/15/19 2200  cefTRIAXone (ROCEPHIN) 2 g in sodium chloride 0.9 % 100 mL IVPB     Discontinue     2 g 200 mL/hr over 30 Minutes Intravenous Every 24 hours 08/15/19 0741     08/14/19 2300  cefTRIAXone (ROCEPHIN) 1 g in sodium chloride 0.9 % 100 mL IVPB  Status:  Discontinued  1 g 200 mL/hr over 30 Minutes Intravenous Every 24 hours 08/14/19 0118 08/15/19 0742   08/14/19 1105  bictegravir-emtricitabine-tenofovir AF (BIKTARVY) 50-200-25 MG per tablet 1 tablet     Discontinue     1 tablet Oral Daily at bedtime 08/14/19 1106     08/14/19 1000  bictegravir-emtricitabine-tenofovir AF (BIKTARVY) 50-200-25 MG per tablet 1 tablet  Status:  Discontinued        1 tablet Oral Daily 08/14/19 0110 08/14/19 1106   08/13/19 2245  cefTRIAXone (ROCEPHIN) 1 g in sodium chloride 0.9 % 100 mL IVPB        1 g 200 mL/hr over 30 Minutes Intravenous  Once 08/13/19 2243 08/14/19 0124        Objective: Vitals: Today's Vitals   08/15/19 0645 08/15/19 0755 08/15/19 0941 08/15/19 1051  BP: (!) 141/93   (!) 152/100  Pulse: 78   89  Resp: 16   17  Temp: 99.2 F (37.3 C)   97.9 F (36.6 C)  TempSrc: Oral   Oral  SpO2: 98%   99%  Weight:      Height:      PainSc:  0-No pain 0-No pain     Intake/Output Summary (Last 24 hours) at 08/15/2019 1121 Last data filed at 08/15/2019 0924 Gross per 24 hour  Intake 3095.63 ml  Output 350 ml  Net 2745.63 ml   Filed Weights   08/13/19 2122 08/14/19 1958  Weight: 82.1 kg 86.3 kg   Weight change: 4.173 kg   Intake/Output from previous day: 07/11 0701 - 07/12 0700 In: 2855.6 [P.O.:370; I.V.:2385.6; IV Piggyback:100] Out: 500 [Urine:500] Intake/Output this shift: Total I/O In: 240 [P.O.:240] Out: -   Examination:  General exam: AAOx3 ,NAD, weak appearing. HEENT:Oral mucosa moist, Ear/Nose WNL grossly,dentition normal. Respiratory system: bilaterally clear,no wheezing or crackles,no use of accessory muscle, non tender. Cardiovascular system: S1 & S2 +, regular, No JVD. Gastrointestinal system: Abdomen soft, NT,ND, BS+. Nervous System:Alert, awake, moving extremities and grossly nonfocal Extremities: No edema, distal peripheral pulses palpable.  Skin: No rashes,no icterus. MSK: Normal muscle bulk,tone, power  Data Reviewed: I have personally reviewed following labs and imaging studies CBC: Recent Labs  Lab 08/13/19 2141 08/14/19 0543  WBC 14.1* 14.7*  NEUTROABS 12.4*  --   HGB 14.9 13.7  HCT 43.7 40.2  MCV 93.0 92.2  PLT 175 272*   Basic Metabolic Panel: Recent Labs  Lab 08/13/19 2141 08/14/19 0543  NA 140 139  K 3.5 3.7  CL 107 107  CO2 22 24  GLUCOSE 174* 130*  BUN 17 14  CREATININE 1.05 0.89  CALCIUM 9.1 8.5*   GFR: Estimated Creatinine Clearance: 98.7 mL/min (by C-G formula based on SCr of 0.89 mg/dL). Liver Function Tests: Recent Labs  Lab 08/13/19 2141  AST 38  ALT 32  ALKPHOS 58   BILITOT 1.5*  PROT 7.1  ALBUMIN 4.0   No results for input(s): LIPASE, AMYLASE in the last 168 hours. No results for input(s): AMMONIA in the last 168 hours. Coagulation Profile: No results for input(s): INR, PROTIME in the last 168 hours. Cardiac Enzymes: No results for input(s): CKTOTAL, CKMB, CKMBINDEX, TROPONINI in the last 168 hours. BNP (last 3 results) No results for input(s): PROBNP in the last 8760 hours. HbA1C: No results for input(s): HGBA1C in the last 72 hours. CBG: No results for input(s): GLUCAP in the last 168 hours. Lipid Profile: No results for input(s): CHOL, HDL, LDLCALC, TRIG, CHOLHDL, LDLDIRECT in the last 72 hours.  Thyroid Function Tests: No results for input(s): TSH, T4TOTAL, FREET4, T3FREE, THYROIDAB in the last 72 hours. Anemia Panel: No results for input(s): VITAMINB12, FOLATE, FERRITIN, TIBC, IRON, RETICCTPCT in the last 72 hours. Sepsis Labs: Recent Labs  Lab 08/13/19 2141 08/13/19 2340  LATICACIDVEN 1.5 0.8    Recent Results (from the past 240 hour(s))  Blood culture (routine x 2)     Status: None (Preliminary result)   Collection Time: 08/13/19 10:42 PM   Specimen: BLOOD RIGHT FOREARM  Result Value Ref Range Status   Specimen Description   Final    BLOOD RIGHT FOREARM Performed at [facility] [address], [place], [place] [address]    Special Requests   Final    BOTTLES DRAWN AEROBIC AND ANAEROBIC Blood Culture adequate volume Performed at [facility] [address], [place], [place] [address]    Culture   Final    NO GROWTH < 12 HOURS Performed at [facility] [address], [place], [place] [address]    Report Status PENDING  Incomplete  SARS Coronavirus 2 by RT PCR (hospital order, performed in [facility] hospital lab) Nasopharyngeal Nasopharyngeal Swab     Status: None   Collection Time: 08/13/19 11:08 PM   Specimen: Nasopharyngeal Swab  Result Value Ref Range Status   SARS  Coronavirus 2 NEGATIVE NEGATIVE Final    Comment: (NOTE) SARS-CoV-2 target nucleic acids are NOT DETECTED.  The SARS-CoV-2 RNA is generally detectable in upper and lower respiratory specimens during the acute phase of infection. The lowest concentration of SARS-CoV-2 viral copies this assay can detect is 250 copies / mL. A negative result does not preclude SARS-CoV-2 infection and should not be used as the sole basis for treatment or other patient management decisions.  A negative result may occur with improper specimen collection / handling, submission of specimen other than nasopharyngeal swab, presence of viral mutation(s) within the areas targeted by this assay, and inadequate number of viral copies (<250 copies / mL). A negative result must be combined with clinical observations, patient history, and epidemiological information.  Fact Sheet for Patients:   StrictlyIdeas.no  Fact Sheet for Healthcare Providers: BankingDealers.co.za  This test is not yet approved or  cleared by the [place] FDA and has been authorized for detection and/or diagnosis of SARS-CoV-2 by FDA under an Emergency Use Authorization (EUA).  This EUA will remain in effect (meaning this test can be used) for the duration of the COVID-19 declaration under Section 564(b)(1) of the Act, 21 U.S.C. section 360bbb-3(b)(1), unless the authorization is terminated or revoked sooner.  Performed at [facility], [place] [address], [place], [place] [address]   Culture, Urine     Status: Abnormal (Preliminary result)   Collection Time: 08/14/19  1:14 AM   Specimen: Urine, Random  Result Value Ref Range Status   Specimen Description   Final    URINE, RANDOM Performed at [facility] [address] [place], [place] [address]    Special Requests   Final    NONE Performed at [facility], [place] [address],  [place], [place] [address]    Culture (A)  Final    >=100,000 COLONIES/mL GRAM NEGATIVE RODS SUSCEPTIBILITIES TO FOLLOW Performed at [facility], [place] [address] [place], [place] [address]    Report Status PENDING  Incomplete      Radiology Studies: DG Chest 2 View  Result Date: 08/13/2019 CLINICAL DATA:  Fever, back pain starting yesterday EXAM: CHEST - 2 VIEW COMPARISON:  None FINDINGS: Mild airways thickening. No focal consolidation. No convincing features of edema, pneumothorax, or effusion. Pulmonary vascularity is normally distributed. The cardiomediastinal contours are unremarkable. No acute osseous or soft tissue abnormality. IMPRESSION: Mild airways thickening could reflect bronchitis or reactive airways disease. No consolidative opacity. Electronically Signed   By: Lovena Le M.D.   On: 08/13/2019 21:57   CT Renal Stone Study  Result Date: 08/14/2019 CLINICAL DATA:  Flank pain, lower back pain began yesterday, now with fevers EXAM: CT ABDOMEN AND PELVIS WITHOUT CONTRAST TECHNIQUE: Multidetector CT imaging of the abdomen and pelvis was performed following the standard protocol without IV contrast. COMPARISON:  None. FINDINGS: Lower chest: Lung bases are clear. Normal heart size. No pericardial effusion. Hepatobiliary: No visible worrisome focal liver lesions. Smooth liver surface contour. Normal hepatic attenuation. Normal gallbladder and biliary tree without visible calcified gallstones. Pancreas: Unremarkable. No pancreatic ductal dilatation or surrounding inflammatory changes. Spleen: Normal in size without focal abnormality. Adrenals/Urinary Tract: Normal adrenal glands. Kidneys are normally located. There is slightly asymmetric left perinephric stranding predominantly about the upper pole. No visible contour deforming renal lesions. No urolithiasis or hydronephrosis. The urinary bladder is circumferentially thickened with extensive perivesicular hazy stranding. Stomach/Bowel:  Distal esophagus, stomach and duodenal sweep are unremarkable. No small bowel wall thickening or dilatation. No evidence of obstruction. A normal appendix is visualized. No colonic dilatation or wall thickening. Scattered colonic diverticula without focal inflammation to suggest diverticulitis. There may be slight mild thickening of the rectum which is possibly reactive. Vascular/Lymphatic: No significant vascular findings are present. No enlarged abdominal or pelvic lymph nodes. Reproductive: Prostatomegaly. Some hazy stranding appears centered upon the prostate and seminal vesicles as well. Other: No abdominopelvic free fluid or free gas. No bowel containing hernias. Inflammatory changes centered upon the bladder, prostate and seminal vesicles. Musculoskeletal: No acute osseous abnormality or suspicious osseous lesion. Multilevel degenerative changes are present in the imaged portions of the spine. Additional degenerative changes in the hips and pelvis. IMPRESSION: 1. Circumferentially thickened urinary bladder with extensive perivesicular hazy stranding, consistent with cystitis with some left perinephric stranding as well which could implicate an ascending tract infection. Correlate with urinalysis. 2. Some hazy stranding appears centered upon the prostate and seminal and minimally thickened rectum as well which could reflect concomitant prostatitis and proctitis. 3. Colonic diverticulosis without evidence of diverticulitis. Electronically Signed   By: Lovena Le M.D.   On: 08/14/2019 00:38     LOS: 1 day   Antonieta Pert, MD [facility]  08/15/2019, 11:21 AM

## 2019-08-15 NOTE — Progress Notes (Signed)
   08/15/19 0248  Assess: MEWS Score  Temp (!) 102.2 F (39 C)  BP (!) 147/99  Pulse Rate 91  Resp 18  SpO2 99 %  O2 Device Room Air  Assess: MEWS Score  MEWS Temp 2  MEWS Systolic 0  MEWS Pulse 0  MEWS RR 0  MEWS LOC 0  MEWS Score 2  MEWS Score Color Yellow  Assess: if the MEWS score is Yellow or Red  Were vital signs taken at a resting state? Yes  Focused Assessment Documented focused assessment  Early Detection of Sepsis Score *See Row Information* Medium  MEWS guidelines implemented *See Row Information* Yes  Treat  MEWS Interventions Administered prn meds/treatments  Take Vital Signs  Increase Vital Sign Frequency  Yellow: Q 2hr X 2 then Q 4hr X 2, if remains yellow, continue Q 4hrs  Escalate  MEWS: Escalate Yellow: discuss with charge nurse/RN and consider discussing with provider and RRT  Notify: Charge Nurse/RN  Name of Charge Nurse/RN Notified Estill Bamberg RN  Date Charge Nurse/RN Notified 08/15/19  Time Charge Nurse/RN Notified 0246  Notify: Provider  Provider Name/Title Blount MD  Date Provider Notified 08/15/19  Time Provider Notified [phone]  Notification Type Page  Notification Reason Other (Comment)  Document  Patient Outcome Other (Comment) (monitoring continued)

## 2019-08-16 DIAGNOSIS — N401 Enlarged prostate with lower urinary tract symptoms: Secondary | ICD-10-CM

## 2019-08-16 DIAGNOSIS — I10 Essential (primary) hypertension: Secondary | ICD-10-CM

## 2019-08-16 DIAGNOSIS — N390 Urinary tract infection, site not specified: Secondary | ICD-10-CM

## 2019-08-16 DIAGNOSIS — R350 Frequency of micturition: Secondary | ICD-10-CM

## 2019-08-16 DIAGNOSIS — B9629 Other Escherichia coli [E. coli] as the cause of diseases classified elsewhere: Secondary | ICD-10-CM

## 2019-08-16 LAB — CBC
HCT: 38.8 % — ABNORMAL LOW (ref 39.0–52.0)
Hemoglobin: 13.1 g/dL (ref 13.0–17.0)
MCH: 31.3 pg (ref 26.0–34.0)
MCHC: 33.8 g/dL (ref 30.0–36.0)
MCV: 92.6 fL (ref 80.0–100.0)
Platelets: 177 10*3/uL (ref 150–400)
RBC: 4.19 MIL/uL — ABNORMAL LOW (ref 4.22–5.81)
RDW: 12.5 % (ref 11.5–15.5)
WBC: 7.7 10*3/uL (ref 4.0–10.5)
nRBC: 0 % (ref 0.0–0.2)

## 2019-08-16 LAB — URINE CULTURE: Culture: >=100000 — AB

## 2019-08-16 LAB — LIPID PANEL
Cholesterol: 133 mg/dL (ref 0–200)
HDL: 35 mg/dL — ABNORMAL LOW (ref >40)
LDL Cholesterol: 77 mg/dL (ref 0–99)
Total CHOL/HDL Ratio: 3.8 RATIO
Triglycerides: 106 mg/dL (ref <150)
VLDL: 21 mg/dL (ref 0–40)

## 2019-08-16 LAB — T-HELPER CELLS (CD4) COUNT (NOT AT ARMC)
CD4 % Helper T Cell: 48 % (ref 33–65)
CD4 T Cell Abs: 565 /uL (ref 400–1790)

## 2019-08-16 MED ORDER — CEPHALEXIN 500 MG PO CAPS: 500.0000 mg | ORAL_CAPSULE | Freq: Four times a day (QID) | ORAL | 0 refills | Status: AC

## 2019-08-16 MED ORDER — AMLODIPINE BESYLATE 5 MG PO TABS
5.0000 mg | ORAL_TABLET | Freq: Every day | ORAL | Status: DC
  Administered 2019-08-16: 5 mg via ORAL
  Filled 2019-08-16: qty 1

## 2019-08-16 MED ORDER — LABETALOL HCL 5 MG/ML IV SOLN
10.0000 mg | Freq: Once | INTRAVENOUS | Status: AC
  Administered 2019-08-16: 10 mg via INTRAVENOUS
  Filled 2019-08-16: qty 4

## 2019-08-16 MED ORDER — AMLODIPINE BESYLATE 5 MG PO TABS: 5.0000 mg | ORAL_TABLET | Freq: Every day | ORAL | 0 refills | Status: DC

## 2019-08-16 NOTE — Progress Notes (Signed)
D/C instructions given to patient. Patient had no questions. NT or writer will wheel patient out once family comes to pick him up

## 2019-08-16 NOTE — Progress Notes (Signed)
Patient has been having elevated Bps, BP is currently 159/102. Patient mentioned that MD has been made aware of BP since admission and is due to diagnosis. Order to notify MD if DBP>100, MD on call sent notification via paging system. Dawson Bills, RN

## 2019-08-16 NOTE — Progress Notes (Addendum)
INFECTIOUS DISEASE PROGRESS NOTE  ID: Sean Lawrence is a 54 y.o. male with  Principal Problem:   Acute pyelonephritis Active Problems:   HIV (human immunodeficiency virus infection) ([place])  Subjective: Asked to see by family Adm after developing acute flank pain.  Has had new HTN in hospital.  Pt up and eating on couch, feels well, ready for d/c.    Abtx:  Anti-infectives (From admission, onward)   Start     Dose/Rate Route Frequency Ordered Stop   08/15/19 2200  cefTRIAXone (ROCEPHIN) 2 g in sodium chloride 0.9 % 100 mL IVPB     Discontinue     2 g 200 mL/hr over 30 Minutes Intravenous Every 24 hours 08/15/19 0741     08/14/19 2300  cefTRIAXone (ROCEPHIN) 1 g in sodium chloride 0.9 % 100 mL IVPB  Status:  Discontinued        1 g 200 mL/hr over 30 Minutes Intravenous Every 24 hours 08/14/19 0118 08/15/19 0742   08/14/19 1105  bictegravir-emtricitabine-tenofovir AF (BIKTARVY) 50-200-25 MG per tablet 1 tablet     Discontinue     1 tablet Oral Daily at bedtime 08/14/19 1106     08/14/19 1000  bictegravir-emtricitabine-tenofovir AF (BIKTARVY) 50-200-25 MG per tablet 1 tablet  Status:  Discontinued        1 tablet Oral Daily 08/14/19 0110 08/14/19 1106   08/13/19 2245  cefTRIAXone (ROCEPHIN) 1 g in sodium chloride 0.9 % 100 mL IVPB        1 g 200 mL/hr over 30 Minutes Intravenous  Once 08/13/19 2243 08/14/19 0124      Medications:  Scheduled: . amLODipine  5 mg Oral Daily  . atorvastatin  10 mg Oral Daily  . bictegravir-emtricitabine-tenofovir AF  1 tablet Oral QHS  . enoxaparin (LOVENOX) injection  40 mg Subcutaneous Q24H  . multivitamin with minerals  1 tablet Oral Daily  . psyllium  1 packet Oral Daily  . QUEtiapine  50 mg Oral QHS    Objective: Vital signs in last 24 hours: Temp:  [98.4 F (36.9 C)-99.8 F (37.7 C)] 98.4 F (36.9 C) (07/13 0520) Pulse Rate:  [64-88] 64 (07/13 0743) Resp:  [16-18] 16 (07/13 0520) BP: (143-159)/(97-104) 143/104 (07/13  0743) SpO2:  [97 %-99 %] 97 % (07/13 0520)   General appearance: alert, cooperative and no distress  Lab Results Recent Labs    08/13/19 2141 08/13/19 2141 08/14/19 0543 08/16/19 0532  WBC 14.1*   < > 14.7* 7.7  HGB 14.9   < > 13.7 13.1  HCT 43.7   < > 40.2 38.8*  NA 140  --  139  --   K 3.5  --  3.7  --   CL 107  --  107  --   CO2 22  --  24  --   BUN 17  --  14  --   CREATININE 1.05  --  0.89  --    < > = values in this interval not displayed.   Liver Panel Recent Labs    08/13/19 2141  PROT 7.1  ALBUMIN 4.0  AST 38  ALT 32  ALKPHOS 58  BILITOT 1.5*   Sedimentation Rate No results for input(s): ESRSEDRATE in the last 72 hours. C-Reactive Protein No results for input(s): CRP in the last 72 hours.  Microbiology: Recent Results (from the past 240 hour(s))  Blood culture (routine x 2)     Status: None (Preliminary result)   Collection Time:  08/13/19 10:42 PM   Specimen: BLOOD RIGHT FOREARM  Result Value Ref Range Status   Specimen Description   Final    BLOOD RIGHT FOREARM Performed at [facility] [address], [place], [place] [address]    Special Requests   Final    BOTTLES DRAWN AEROBIC AND ANAEROBIC Blood Culture adequate volume Performed at [facility] [address] [place], [place] [address]    Culture   Final    NO GROWTH 2 DAYS Performed at [facility] [address], [place], [place] [address]    Report Status PENDING  Incomplete  SARS Coronavirus 2 by RT PCR (hospital order, performed in [facility] hospital lab) Nasopharyngeal Nasopharyngeal Swab     Status: None   Collection Time: 08/13/19 11:08 PM   Specimen: Nasopharyngeal Swab  Result Value Ref Range Status   SARS Coronavirus 2 NEGATIVE NEGATIVE Final    Comment: (NOTE) SARS-CoV-2 target nucleic acids are NOT DETECTED.  The SARS-CoV-2 RNA is generally detectable in upper and lower respiratory specimens during the acute phase of  infection. The lowest concentration of SARS-CoV-2 viral copies this assay can detect is 250 copies / mL. A negative result does not preclude SARS-CoV-2 infection and should not be used as the sole basis for treatment or other patient management decisions.  A negative result may occur with improper specimen collection / handling, submission of specimen other than nasopharyngeal swab, presence of viral mutation(s) within the areas targeted by this assay, and inadequate number of viral copies (<250 copies / mL). A negative result must be combined with clinical observations, patient history, and epidemiological information.  Fact Sheet for Patients:   StrictlyIdeas.no  Fact Sheet for Healthcare Providers: BankingDealers.co.za  This test is not yet approved or  cleared by the [place] FDA and has been authorized for detection and/or diagnosis of SARS-CoV-2 by FDA under an Emergency Use Authorization (EUA).  This EUA will remain in effect (meaning this test can be used) for the duration of the COVID-19 declaration under Section 564(b)(1) of the Act, 21 U.S.C. section 360bbb-3(b)(1), unless the authorization is terminated or revoked sooner.  Performed at [facility], [place] [address], [place], [place] [address]   Culture, Urine     Status: Abnormal   Collection Time: 08/14/19  1:14 AM   Specimen: Urine, Random  Result Value Ref Range Status   Specimen Description   Final    URINE, RANDOM Performed at [facility] [address] [place], [place] [address]    Special Requests   Final    NONE Performed at [facility], [place] [address] [place], [place] [address]    Culture >=100,000 COLONIES/mL ESCHERICHIA COLI (A)  Final   Report Status 08/16/2019 FINAL  Final   Organism ID, Bacteria ESCHERICHIA COLI (A)  Final      Susceptibility   Escherichia coli - MIC*    AMPICILLIN <=2  SENSITIVE Sensitive     CEFAZOLIN <=4 SENSITIVE Sensitive     CEFTRIAXONE <=0.25 SENSITIVE Sensitive     CIPROFLOXACIN <=0.25 SENSITIVE Sensitive     GENTAMICIN <=1 SENSITIVE Sensitive     IMIPENEM <=0.25 SENSITIVE Sensitive     NITROFURANTOIN <=16 SENSITIVE Sensitive     TRIMETH/SULFA <=20 SENSITIVE Sensitive     AMPICILLIN/SULBACTAM <=2 SENSITIVE Sensitive     PIP/TAZO <=4 SENSITIVE Sensitive     * >=100,000 COLONIES/mL ESCHERICHIA COLI    Studies/Results: No results  found.   Assessment/Plan: E coli UTI   BCx (-)  Has pan-sens E coli. Can be d/c on amox, flouroquinolone ect  Aim for 7 days total  BPH  Agree he needs Uro eval at this point.   His PSA have been done at his PCP's office.   HTN  Need for long term management of this is unclear.   Pt attributes to his recent, daily migraines.   Could also be his HTN driving his daily migraines?  HIV  He is scheduled for labs this week for his f/u visit with Dr Linus Salmons.   I have ordered them today at the patient's request.   He will continue on biktarvy. He is doing well  HIV 1 RNA Quant (copies/mL)  Date Value  08/04/2018 <20 NOT DETECTED  08/17/2017 35 (H)  08/19/2016 <20 NOT DETECTED   CD4 T Cell Abs (/uL)  Date Value  08/04/2018 744  08/17/2017 860  08/19/2016 720     Total days of antibiotics: 3 ceftriaxone         Bobby Rumpf MD, FACP Infectious Diseases (pager) [phone] [email or web address] 08/16/2019, 12:05 PM  LOS: 2 days

## 2019-08-16 NOTE — Discharge Summary (Signed)
Physician Discharge Summary  Sean Lawrence HYW:[id number] DOB: 08-10-1965 DOA: 08/13/2019  PCP: Lujean Amel, MD  Admit date: 08/13/2019 Discharge date: 08/16/2019  Admitted From: home Disposition:  Home  Recommendations for Outpatient Follow-up:  1. Follow up with PCP in 1-2 weeks 2. Please obtain BMP/CBC in one week 3. Please follow up on the following pending results:  Home Health:No  Equipment/Devices:None  Discharge Condition:Stable Code Status:Full Diet recommendation:  Diet Order            Diet - low sodium heart healthy           Diet regular Room service appropriate? Yes; Fluid consistency: Thin  Diet effective now                 Brief/Interim Summary: 54 y.o. male with medical history significant of HIV on HAART presented to the ED with c/o Back pain and fever up to 104.7 at home with dysuria.  No rectal pain.  Has had COVID vaccine .In the ED febrile UA consistent with UTI CT showed circumferentially thickened urinary bladder with extensive perivascular hazy stranding consistent with cystitis with left perinephric stranding which could implicate an ascending tract infection, hazy stranding appears centered upon the prostate and seminal and minimally thickened rectum as well which could reflect concomitant prostatitis and proctitis He was placed on ceftriaxone and and was admitted for further management. Patient was managed with IV antibiotics. He has clinically improved at this time no dysuria no nausea vomiting no flank pain no fever. Urine culture came back positive for E. coli and pansensitive.  Will discharge on Keflex for 12 more days course given associated possible proctitis/prostatitis and he is to follow-up with his PCP/urology during this timeframe to see if he needs further extension.\ Dr. Gloriann Loan was contacted and discussed agrees with current t=recommendations on therapy and follow-up with PCP/or urology.  Discharge Diagnoses:  Principal Problem:    Acute pyelonephritis Active Problems:   HIV (human immunodeficiency virus infection) ([place])  E. coli acute pyelonephritis with possible concomitant prostatitis/proctitis: E. coli pansensitive we will switch him to Keflex and discharge him on oral antibiotics.  PCP follow-up in 1 week and can follow-up with urology as needed basis I discussed the case with Dr. Gloriann Loan FROM UROLOGY who agrees with the recommendation.   HIV on Biktarvy continue the same, stable viral load on last follow-up on 08/04/2018  HLD continue Lipitor  Uncontrolled blood pressure: Has been needing IV pain medicine.  He is placed on amlodipine 5 mg and will be discharged with instruction to monitor blood pressure at home and follow-up with PCP in 1 week.    Consults:  Urology  Subjective: Resting comfortably no nausea vomiting fever chills.  No diarrhea constipation no dysuria no burning.  Discharge Exam: Vitals:   08/16/19 1317 08/16/19 1426  BP: (!) 147/106 (!) 148/99  Pulse: 69 72  Resp: 16 18  Temp: 98.2 F (36.8 C) 98.1 F (36.7 C)  SpO2: 100% 100%   General: Pt is alert, awake, not in acute distress Cardiovascular: RRR, S1/S2 +, no rubs, no gallops Respiratory: CTA bilaterally, no wheezing, no rhonchi Abdominal: Soft, NT, ND, bowel sounds + Extremities: no edema, no cyanosis  Discharge Instructions  Discharge Instructions    Diet - low sodium heart healthy   Complete by: As directed    Discharge instructions   Complete by: As directed    Please monitor blood pressure at home before taking your BP  medication. The family doctor to  adjust her blood pressure medication.  Please call call MD or return to ER for similar or worsening recurring problem that brought you to hospital or if any fever,nausea/vomiting,abdominal pain, uncontrolled pain, chest pain,  shortness of breath or any other alarming symptoms.  You can follow-up with Dr. Gloriann Loan or [facility] as needed basis if you have any  symptoms  Please avoid alcohol, smoking, or any other illicit substance and maintain healthy habits including taking your regular medications as prescribed.  You were cared for by a hospitalist during your hospital stay. If you have any questions about your discharge medications or the care you received while you were in the hospital after you are discharged, you can call the unit and ask to speak with the hospitalist on call if the hospitalist that took care of you is not available.  Once you are discharged, your primary care physician will handle any further medical issues. Please note that NO REFILLS for any discharge medications will be authorized once you are discharged, as it is imperative that you return to your primary care physician (or establish a relationship with a primary care physician if you do not have one) for your aftercare needs so that they can reassess your need for medications and monitor your lab values   Increase activity slowly   Complete by: As directed      Allergies as of 08/16/2019      Reactions   Wasp Venom Anaphylaxis   Yellow Jackets only allergy    Sulfa Antibiotics Other (See Comments)   Eye cream containing sulfa       Medication List    TAKE these medications   acetaminophen 325 MG tablet Commonly known as: TYLENOL Take 650 mg by mouth every 6 (six) hours as needed.   amLODipine 5 MG tablet Commonly known as: NORVASC Take 1 tablet (5 mg total) by mouth daily. Hold for systolic blood pressure less than 110   aspirin-acetaminophen-caffeine 250-250-65 MG tablet Commonly known as: EXCEDRIN MIGRAINE Take 2 tablets by mouth every 6 (six) hours as needed for headache.   atorvastatin 10 MG tablet Commonly known as: LIPITOR Take 10 mg by mouth daily.   Biktarvy 50-200-25 MG Tabs tablet Generic drug: bictegravir-emtricitabine-tenofovir AF Take 1 tablet by mouth daily.   cephALEXin 500 MG capsule Commonly known as: KEFLEX Take 1 capsule (500 mg  total) by mouth 4 (four) times daily for 12 days.   EPINEPHrine 0.15 MG/0.3ML injection Commonly known as: EPIPEN JR Inject 0.15 mg into the muscle as needed.   multivitamin with minerals tablet Take 1 tablet by mouth daily.   QUEtiapine 25 MG tablet Commonly known as: SEROQUEL Take 50 mg by mouth at bedtime.   SUMAtriptan 100 MG tablet Commonly known as: IMITREX Take 100 mg by mouth every 2 (two) hours as needed.       Follow-up Information    Koirala, Dibas, MD Follow up in 1 week(s).   Specialty: Family Medicine Contact information: [place] [address] [phone]        Thayer Headings, MD .   Specialty: Infectious Diseases Contact information: [address] [address] [phone]        Lucas Mallow, MD Follow up.   Specialty: Urology Why: Follow-up on as-needed basis if you have recurrent symptoms, dysuria , flank pain or fever Contact information: [place] [address] [phone]  Allergies  Allergen Reactions  . Wasp Venom Anaphylaxis    Yellow Jackets only allergy   . Sulfa Antibiotics Other (See Comments)    Eye cream containing sulfa     The results of significant diagnostics from this hospitalization (including imaging, microbiology, ancillary and laboratory) are listed below for reference.    Microbiology: Recent Results (from the past 240 hour(s))  Blood culture (routine x 2)     Status: None (Preliminary result)   Collection Time: 08/13/19 10:42 PM   Specimen: BLOOD RIGHT FOREARM  Result Value Ref Range Status   Specimen Description   Final    BLOOD RIGHT FOREARM Performed at [facility] [address] [place], [place] [address]    Special Requests   Final    BOTTLES DRAWN AEROBIC AND ANAEROBIC Blood Culture adequate volume Performed at [facility] [address] [place], [place]  [address]    Culture   Final    NO GROWTH 2 DAYS Performed at [facility] [address], [place], [place] [address]    Report Status PENDING  Incomplete  SARS Coronavirus 2 by RT PCR (hospital order, performed in [facility] hospital lab) Nasopharyngeal Nasopharyngeal Swab     Status: None   Collection Time: 08/13/19 11:08 PM   Specimen: Nasopharyngeal Swab  Result Value Ref Range Status   SARS Coronavirus 2 NEGATIVE NEGATIVE Final    Comment: (NOTE) SARS-CoV-2 target nucleic acids are NOT DETECTED.  The SARS-CoV-2 RNA is generally detectable in upper and lower respiratory specimens during the acute phase of infection. The lowest concentration of SARS-CoV-2 viral copies this assay can detect is 250 copies / mL. A negative result does not preclude SARS-CoV-2 infection and should not be used as the sole basis for treatment or other patient management decisions.  A negative result may occur with improper specimen collection / handling, submission of specimen other than nasopharyngeal swab, presence of viral mutation(s) within the areas targeted by this assay, and inadequate number of viral copies (<250 copies / mL). A negative result must be combined with clinical observations, patient history, and epidemiological information.  Fact Sheet for Patients:   StrictlyIdeas.no  Fact Sheet for Healthcare Providers: BankingDealers.co.za  This test is not yet approved or  cleared by the [place] FDA and has been authorized for detection and/or diagnosis of SARS-CoV-2 by FDA under an Emergency Use Authorization (EUA).  This EUA will remain in effect (meaning this test can be used) for the duration of the COVID-19 declaration under Section 564(b)(1) of the Act, 21 U.S.C. section 360bbb-3(b)(1), unless the authorization is terminated or revoked sooner.  Performed at [facility], [place] [address], [place], [place]  [address]   Culture, Urine     Status: Abnormal   Collection Time: 08/14/19  1:14 AM   Specimen: Urine, Random  Result Value Ref Range Status   Specimen Description   Final    URINE, RANDOM Performed at [facility] [address], [place], [place] [address]    Special Requests   Final    NONE Performed at [facility], [place] [address] [place], [place] [address]    Culture >=100,000 COLONIES/mL ESCHERICHIA COLI (A)  Final   Report Status 08/16/2019 FINAL  Final   Organism ID, Bacteria ESCHERICHIA COLI (A)  Final      Susceptibility   Escherichia coli - MIC*    AMPICILLIN <=2 SENSITIVE Sensitive  CEFAZOLIN <=4 SENSITIVE Sensitive     CEFTRIAXONE <=0.25 SENSITIVE Sensitive     CIPROFLOXACIN <=0.25 SENSITIVE Sensitive     GENTAMICIN <=1 SENSITIVE Sensitive     IMIPENEM <=0.25 SENSITIVE Sensitive     NITROFURANTOIN <=16 SENSITIVE Sensitive     TRIMETH/SULFA <=20 SENSITIVE Sensitive     AMPICILLIN/SULBACTAM <=2 SENSITIVE Sensitive     PIP/TAZO <=4 SENSITIVE Sensitive     * >=100,000 COLONIES/mL ESCHERICHIA COLI    Procedures/Studies: DG Chest 2 View  Result Date: 08/13/2019 CLINICAL DATA:  Fever, back pain starting yesterday EXAM: CHEST - 2 VIEW COMPARISON:  None FINDINGS: Mild airways thickening. No focal consolidation. No convincing features of edema, pneumothorax, or effusion. Pulmonary vascularity is normally distributed. The cardiomediastinal contours are unremarkable. No acute osseous or soft tissue abnormality. IMPRESSION: Mild airways thickening could reflect bronchitis or reactive airways disease. No consolidative opacity. Electronically Signed   By: Lovena Le M.D.   On: 08/13/2019 21:57   CT Renal Stone Study  Result Date: 08/14/2019 CLINICAL DATA:  Flank pain, lower back pain began yesterday, now with fevers EXAM: CT ABDOMEN AND PELVIS WITHOUT CONTRAST TECHNIQUE: Multidetector CT imaging of the abdomen and pelvis was performed  following the standard protocol without IV contrast. COMPARISON:  None. FINDINGS: Lower chest: Lung bases are clear. Normal heart size. No pericardial effusion. Hepatobiliary: No visible worrisome focal liver lesions. Smooth liver surface contour. Normal hepatic attenuation. Normal gallbladder and biliary tree without visible calcified gallstones. Pancreas: Unremarkable. No pancreatic ductal dilatation or surrounding inflammatory changes. Spleen: Normal in size without focal abnormality. Adrenals/Urinary Tract: Normal adrenal glands. Kidneys are normally located. There is slightly asymmetric left perinephric stranding predominantly about the upper pole. No visible contour deforming renal lesions. No urolithiasis or hydronephrosis. The urinary bladder is circumferentially thickened with extensive perivesicular hazy stranding. Stomach/Bowel: Distal esophagus, stomach and duodenal sweep are unremarkable. No small bowel wall thickening or dilatation. No evidence of obstruction. A normal appendix is visualized. No colonic dilatation or wall thickening. Scattered colonic diverticula without focal inflammation to suggest diverticulitis. There may be slight mild thickening of the rectum which is possibly reactive. Vascular/Lymphatic: No significant vascular findings are present. No enlarged abdominal or pelvic lymph nodes. Reproductive: Prostatomegaly. Some hazy stranding appears centered upon the prostate and seminal vesicles as well. Other: No abdominopelvic free fluid or free gas. No bowel containing hernias. Inflammatory changes centered upon the bladder, prostate and seminal vesicles. Musculoskeletal: No acute osseous abnormality or suspicious osseous lesion. Multilevel degenerative changes are present in the imaged portions of the spine. Additional degenerative changes in the hips and pelvis. IMPRESSION: 1. Circumferentially thickened urinary bladder with extensive perivesicular hazy stranding, consistent with  cystitis with some left perinephric stranding as well which could implicate an ascending tract infection. Correlate with urinalysis. 2. Some hazy stranding appears centered upon the prostate and seminal and minimally thickened rectum as well which could reflect concomitant prostatitis and proctitis. 3. Colonic diverticulosis without evidence of diverticulitis. Electronically Signed   By: Lovena Le M.D.   On: 08/14/2019 00:38    Labs: BNP (last 3 results) No results for input(s): BNP in the last 8760 hours. Basic Metabolic Panel: Recent Labs  Lab 08/13/19 2141 08/14/19 0543  NA 140 139  K 3.5 3.7  CL 107 107  CO2 22 24  GLUCOSE 174* 130*  BUN 17 14  CREATININE 1.05 0.89  CALCIUM 9.1 8.5*   Liver Function Tests: Recent Labs  Lab 08/13/19 2141  AST 38  ALT 32  ALKPHOS 58  BILITOT 1.5*  PROT 7.1  ALBUMIN 4.0   No results for input(s): LIPASE, AMYLASE in the last 168 hours. No results for input(s): AMMONIA in the last 168 hours. CBC: Recent Labs  Lab 08/13/19 2141 08/14/19 0543 08/16/19 0532  WBC 14.1* 14.7* 7.7  NEUTROABS 12.4*  --   --   HGB 14.9 13.7 13.1  HCT 43.7 40.2 38.8*  MCV 93.0 92.2 92.6  PLT 175 132* 177   Cardiac Enzymes: No results for input(s): CKTOTAL, CKMB, CKMBINDEX, TROPONINI in the last 168 hours. BNP: Invalid input(s): POCBNP CBG: No results for input(s): GLUCAP in the last 168 hours. D-Dimer No results for input(s): DDIMER in the last 72 hours. Hgb A1c No results for input(s): HGBA1C in the last 72 hours. Lipid Profile Recent Labs    08/16/19 1035  CHOL 133  HDL 35*  LDLCALC 77  TRIG 106  CHOLHDL 3.8   Thyroid function studies No results for input(s): TSH, T4TOTAL, T3FREE, THYROIDAB in the last 72 hours.  Invalid input(s): FREET3 Anemia work up No results for input(s): VITAMINB12, FOLATE, FERRITIN, TIBC, IRON, RETICCTPCT in the last 72 hours. Urinalysis    Component Value Date/Time   COLORURINE YELLOW 08/13/2019 2141    APPEARANCEUR HAZY (A) 08/13/2019 2141   LABSPEC 1.026 08/13/2019 2141   PHURINE 5.0 08/13/2019 2141   GLUCOSEU NEGATIVE 08/13/2019 2141   HGBUR MODERATE (A) 08/13/2019 2141   BILIRUBINUR NEGATIVE 08/13/2019 2141   KETONESUR NEGATIVE 08/13/2019 2141   PROTEINUR 100 (A) 08/13/2019 2141   UROBILINOGEN 0.2 09/18/2010 1232   NITRITE NEGATIVE 08/13/2019 2141   LEUKOCYTESUR LARGE (A) 08/13/2019 2141   Sepsis Labs Invalid input(s): PROCALCITONIN,  WBC,  LACTICIDVEN Microbiology Recent Results (from the past 240 hour(s))  Blood culture (routine x 2)     Status: None (Preliminary result)   Collection Time: 08/13/19 10:42 PM   Specimen: BLOOD RIGHT FOREARM  Result Value Ref Range Status   Specimen Description   Final    BLOOD RIGHT FOREARM Performed at [facility], [place] [address] [place], [place] [address]    Special Requests   Final    BOTTLES DRAWN AEROBIC AND ANAEROBIC Blood Culture adequate volume Performed at [facility] [address], [place], [place] [address]    Culture   Final    NO GROWTH 2 DAYS Performed at [facility] [address] [place], [place] [address]    Report Status PENDING  Incomplete  SARS Coronavirus 2 by RT PCR (hospital order, performed in [facility] hospital lab) Nasopharyngeal Nasopharyngeal Swab     Status: None   Collection Time: 08/13/19 11:08 PM   Specimen: Nasopharyngeal Swab  Result Value Ref Range Status   SARS Coronavirus 2 NEGATIVE NEGATIVE Final    Comment: (NOTE) SARS-CoV-2 target nucleic acids are NOT DETECTED.  The SARS-CoV-2 RNA is generally detectable in upper and lower respiratory specimens during the acute phase of infection. The lowest concentration of SARS-CoV-2 viral copies this assay can detect is 250 copies / mL. A negative result does not preclude SARS-CoV-2 infection and should not be used as the sole basis for treatment or other patient management decisions.  A negative result  may occur with improper specimen collection / handling, submission of specimen other than nasopharyngeal swab, presence of viral mutation(s) within the areas targeted by this assay, and inadequate number of viral copies (<250 copies / mL). A negative result must be combined with clinical observations, patient  history, and epidemiological information.  Fact Sheet for Patients:   StrictlyIdeas.no  Fact Sheet for Healthcare Providers: BankingDealers.co.za  This test is not yet approved or  cleared by the [place] FDA and has been authorized for detection and/or diagnosis of SARS-CoV-2 by FDA under an Emergency Use Authorization (EUA).  This EUA will remain in effect (meaning this test can be used) for the duration of the COVID-19 declaration under Section 564(b)(1) of the Act, 21 U.S.C. section 360bbb-3(b)(1), unless the authorization is terminated or revoked sooner.  Performed at [facility], [place] [address] [place], [place] [address]   Culture, Urine     Status: Abnormal   Collection Time: 08/14/19  1:14 AM   Specimen: Urine, Random  Result Value Ref Range Status   Specimen Description   Final    URINE, RANDOM Performed at [facility] [address], [place], [place] [address]    Special Requests   Final    NONE Performed at [facility], [place] [address], [place], [place] [address]    Culture >=100,000 COLONIES/mL ESCHERICHIA COLI (A)  Final   Report Status 08/16/2019 FINAL  Final   Organism ID, Bacteria ESCHERICHIA COLI (A)  Final      Susceptibility   Escherichia coli - MIC*    AMPICILLIN <=2 SENSITIVE Sensitive     CEFAZOLIN <=4 SENSITIVE Sensitive     CEFTRIAXONE <=0.25 SENSITIVE Sensitive     CIPROFLOXACIN <=0.25 SENSITIVE Sensitive     GENTAMICIN <=1 SENSITIVE Sensitive     IMIPENEM <=0.25 SENSITIVE Sensitive     NITROFURANTOIN <=16 SENSITIVE Sensitive      TRIMETH/SULFA <=20 SENSITIVE Sensitive     AMPICILLIN/SULBACTAM <=2 SENSITIVE Sensitive     PIP/TAZO <=4 SENSITIVE Sensitive     * >=100,000 COLONIES/mL ESCHERICHIA COLI     Time coordinating discharge: 25  minutes  SIGNED: Antonieta Pert, MD  [facility] 08/16/2019, 2:39 PM  If 7PM-7AM, please contact night-coverage [email or web address]

## 2019-08-17 LAB — RPR: RPR Ser Ql: NONREACTIVE

## 2019-08-17 LAB — HIV-1 RNA QUANT-NO REFLEX-BLD
HIV 1 RNA Quant: 20 copies/mL
LOG10 HIV-1 RNA: 1.301 log10copy/mL

## 2019-08-18 ENCOUNTER — Other Ambulatory Visit: Payer: 59

## 2019-08-19 LAB — CULTURE, BLOOD (ROUTINE X 2)
Culture: NO GROWTH
Special Requests: ADEQUATE

## 2019-09-01 ENCOUNTER — Encounter: Payer: 59 | Admitting: Internal Medicine

## 2019-09-06 ENCOUNTER — Ambulatory Visit: Payer: 59 | Admitting: Internal Medicine

## 2019-09-06 ENCOUNTER — Other Ambulatory Visit: Payer: Self-pay

## 2019-09-06 ENCOUNTER — Encounter: Payer: Self-pay | Admitting: Internal Medicine

## 2019-09-06 VITALS — BP 167/95 | HR 78 | Temp 98.5°F | Wt 189.0 lb

## 2019-09-06 DIAGNOSIS — Z21 Asymptomatic human immunodeficiency virus [HIV] infection status: Secondary | ICD-10-CM

## 2019-09-06 DIAGNOSIS — Z79899 Other long term (current) drug therapy: Secondary | ICD-10-CM | POA: Diagnosis not present

## 2019-09-06 DIAGNOSIS — Z113 Encounter for screening for infections with a predominantly sexual mode of transmission: Secondary | ICD-10-CM | POA: Diagnosis not present

## 2019-09-06 NOTE — Progress Notes (Signed)
   Subjective:    Patient ID: SABA NEUMAN, male    DOB: 1965-08-11, 54 y.o.   MRN: [id number]  HPI Here for follow up of HIV and hsfu He was hospitalized recently with pyelonephritis vs prostatitis and culture with pansensitive E coli.  Completed a prolonged course of Keflex after discharge.   Improved now.  Following with urology.  PSA was elevated.  He continues with Biktarvy and no issues with the medication. No missed doses.     Review of Systems  Constitutional: Negative for fatigue.  Gastrointestinal: Negative for diarrhea.  Skin: Negative for rash.       Objective:   Physical Exam Eyes:     General: No scleral icterus. Skin:    Findings: No rash.  Neurological:     General: No focal deficit present.     Mental Status: He is alert.  Psychiatric:        Mood and Affect: Mood normal.   SH: no tobacco        Assessment & Plan:

## 2019-09-29 ENCOUNTER — Other Ambulatory Visit: Payer: Self-pay | Admitting: Internal Medicine

## 2019-10-12 ENCOUNTER — Other Ambulatory Visit: Payer: Self-pay | Admitting: General Surgery

## 2019-12-13 ENCOUNTER — Other Ambulatory Visit: Payer: Self-pay

## 2019-12-13 ENCOUNTER — Encounter (HOSPITAL_BASED_OUTPATIENT_CLINIC_OR_DEPARTMENT_OTHER): Payer: Self-pay | Admitting: General Surgery

## 2019-12-14 ENCOUNTER — Encounter (HOSPITAL_BASED_OUTPATIENT_CLINIC_OR_DEPARTMENT_OTHER)
Admission: RE | Admit: 2019-12-14 | Discharge: 2019-12-14 | Disposition: A | Discharge status: Home / Self Care | Payer: 59 | Source: Ambulatory Visit | Attending: General Surgery | Admitting: General Surgery

## 2019-12-14 DIAGNOSIS — Z01810 Encounter for preprocedural cardiovascular examination: Secondary | ICD-10-CM | POA: Diagnosis present

## 2019-12-14 NOTE — Progress Notes (Signed)

## 2019-12-17 ENCOUNTER — Other Ambulatory Visit (HOSPITAL_COMMUNITY)
Admission: RE | Admit: 2019-12-17 | Discharge: 2019-12-17 | Disposition: A | Discharge status: Home / Self Care | Payer: 59 | Source: Ambulatory Visit | Attending: General Surgery | Admitting: General Surgery

## 2019-12-17 DIAGNOSIS — Z20822 Contact with and (suspected) exposure to covid-19: Secondary | ICD-10-CM | POA: Diagnosis not present

## 2019-12-17 DIAGNOSIS — Z01812 Encounter for preprocedural laboratory examination: Secondary | ICD-10-CM | POA: Insufficient documentation

## 2019-12-17 LAB — SARS CORONAVIRUS 2 (TAT 6-24 HRS): SARS Coronavirus 2: NEGATIVE

## 2019-12-21 ENCOUNTER — Encounter (HOSPITAL_BASED_OUTPATIENT_CLINIC_OR_DEPARTMENT_OTHER): Payer: Self-pay | Admitting: General Surgery

## 2019-12-21 ENCOUNTER — Ambulatory Visit (HOSPITAL_BASED_OUTPATIENT_CLINIC_OR_DEPARTMENT_OTHER): Payer: 59 | Admitting: Anesthesiology

## 2019-12-21 ENCOUNTER — Ambulatory Visit (HOSPITAL_BASED_OUTPATIENT_CLINIC_OR_DEPARTMENT_OTHER)
Admission: RE | Admit: 2019-12-21 | Discharge: 2019-12-21 | Disposition: A | Discharge status: Home / Self Care | Payer: 59 | Attending: General Surgery | Admitting: General Surgery

## 2019-12-21 ENCOUNTER — Other Ambulatory Visit: Payer: Self-pay

## 2019-12-21 ENCOUNTER — Encounter (HOSPITAL_BASED_OUTPATIENT_CLINIC_OR_DEPARTMENT_OTHER)
Admission: RE | Disposition: A | Discharge status: Home / Self Care | Payer: Self-pay | Source: Home / Self Care | Attending: General Surgery

## 2019-12-21 DIAGNOSIS — K4090 Unilateral inguinal hernia, without obstruction or gangrene, not specified as recurrent: Secondary | ICD-10-CM | POA: Diagnosis not present

## 2019-12-21 DIAGNOSIS — Z79899 Other long term (current) drug therapy: Secondary | ICD-10-CM | POA: Insufficient documentation

## 2019-12-21 HISTORY — PX: INGUINAL HERNIA REPAIR: SHX194

## 2019-12-21 HISTORY — PX: INSERTION OF MESH: SHX5868

## 2019-12-21 SURGERY — REPAIR, HERNIA, INGUINAL, ADULT
Anesthesia: General | Site: Groin | Laterality: Right

## 2019-12-21 MED ORDER — LIDOCAINE 2% (20 MG/ML) 5 ML SYRINGE
INTRAMUSCULAR | Status: AC
  Filled 2019-12-21: qty 5

## 2019-12-21 MED ORDER — GABAPENTIN 100 MG PO CAPS
ORAL_CAPSULE | ORAL | Status: AC
  Filled 2019-12-21: qty 1

## 2019-12-21 MED ORDER — ENSURE PRE-SURGERY PO LIQD: 296.0000 mL | Freq: Once | ORAL | Status: DC

## 2019-12-21 MED ORDER — BUPIVACAINE HCL (PF) 0.5 % IJ SOLN
INTRAMUSCULAR | Status: DC | PRN
  Administered 2019-12-21: 30 mL

## 2019-12-21 MED ORDER — KETOROLAC TROMETHAMINE 15 MG/ML IJ SOLN
INTRAMUSCULAR | Status: AC
  Filled 2019-12-21: qty 1

## 2019-12-21 MED ORDER — CEFAZOLIN SODIUM-DEXTROSE 2-4 GM/100ML-% IV SOLN
2.0000 g | INTRAVENOUS | Status: AC
  Administered 2019-12-21: 2 g via INTRAVENOUS

## 2019-12-21 MED ORDER — ACETAMINOPHEN 500 MG PO TABS
1000.0000 mg | ORAL_TABLET | ORAL | Status: AC
  Administered 2019-12-21: 1000 mg via ORAL

## 2019-12-21 MED ORDER — LACTATED RINGERS IV SOLN: INTRAVENOUS | Status: DC

## 2019-12-21 MED ORDER — DEXAMETHASONE SODIUM PHOSPHATE 4 MG/ML IJ SOLN
INTRAMUSCULAR | Status: DC | PRN
  Administered 2019-12-21: 10 mg via INTRAVENOUS

## 2019-12-21 MED ORDER — ONDANSETRON HCL 4 MG/2ML IJ SOLN: 4.0000 mg | Freq: Once | INTRAMUSCULAR | Status: DC | PRN

## 2019-12-21 MED ORDER — PROPOFOL 10 MG/ML IV BOLUS
INTRAVENOUS | Status: DC | PRN
  Administered 2019-12-21: 200 mg via INTRAVENOUS

## 2019-12-21 MED ORDER — LIDOCAINE 2% (20 MG/ML) 5 ML SYRINGE
INTRAMUSCULAR | Status: DC | PRN
  Administered 2019-12-21: 100 mg via INTRAVENOUS

## 2019-12-21 MED ORDER — MIDAZOLAM HCL 2 MG/2ML IJ SOLN
INTRAMUSCULAR | Status: AC
  Filled 2019-12-21: qty 2

## 2019-12-21 MED ORDER — MIDAZOLAM HCL 2 MG/2ML IJ SOLN
2.0000 mg | Freq: Once | INTRAMUSCULAR | Status: AC
  Administered 2019-12-21: 2 mg via INTRAVENOUS

## 2019-12-21 MED ORDER — FENTANYL CITRATE (PF) 100 MCG/2ML IJ SOLN: 25.0000 ug | INTRAMUSCULAR | Status: DC | PRN

## 2019-12-21 MED ORDER — FENTANYL CITRATE (PF) 100 MCG/2ML IJ SOLN
INTRAMUSCULAR | Status: AC
  Filled 2019-12-21: qty 2

## 2019-12-21 MED ORDER — OXYCODONE HCL 5 MG PO TABS: 5.0000 mg | ORAL_TABLET | Freq: Once | ORAL | Status: DC | PRN

## 2019-12-21 MED ORDER — BUPIVACAINE HCL (PF) 0.25 % IJ SOLN
INTRAMUSCULAR | Status: AC
  Filled 2019-12-21: qty 30

## 2019-12-21 MED ORDER — PROPOFOL 500 MG/50ML IV EMUL
INTRAVENOUS | Status: AC
  Filled 2019-12-21: qty 50

## 2019-12-21 MED ORDER — ONDANSETRON HCL 4 MG/2ML IJ SOLN
INTRAMUSCULAR | Status: DC | PRN
  Administered 2019-12-21: 4 mg via INTRAVENOUS

## 2019-12-21 MED ORDER — ACETAMINOPHEN 500 MG PO TABS
ORAL_TABLET | ORAL | Status: AC
  Filled 2019-12-21: qty 2

## 2019-12-21 MED ORDER — BUPIVACAINE HCL (PF) 0.25 % IJ SOLN
INTRAMUSCULAR | Status: DC | PRN
  Administered 2019-12-21: 7 mL

## 2019-12-21 MED ORDER — OXYCODONE HCL 5 MG PO TABS
5.0000 mg | ORAL_TABLET | Freq: Four times a day (QID) | ORAL | 0 refills | Status: DC | PRN
Start: 2019-12-21 — End: 2020-09-05

## 2019-12-21 MED ORDER — CEFAZOLIN SODIUM-DEXTROSE 2-4 GM/100ML-% IV SOLN
INTRAVENOUS | Status: AC
  Filled 2019-12-21: qty 100

## 2019-12-21 MED ORDER — FENTANYL CITRATE (PF) 100 MCG/2ML IJ SOLN
50.0000 ug | Freq: Once | INTRAMUSCULAR | Status: AC
  Administered 2019-12-21: 50 ug via INTRAVENOUS

## 2019-12-21 MED ORDER — KETOROLAC TROMETHAMINE 15 MG/ML IJ SOLN
15.0000 mg | INTRAMUSCULAR | Status: AC
  Administered 2019-12-21: 15 mg via INTRAVENOUS

## 2019-12-21 MED ORDER — GABAPENTIN 100 MG PO CAPS
100.0000 mg | ORAL_CAPSULE | ORAL | Status: AC
  Administered 2019-12-21: 100 mg via ORAL

## 2019-12-21 MED ORDER — FENTANYL CITRATE (PF) 100 MCG/2ML IJ SOLN
INTRAMUSCULAR | Status: DC | PRN
  Administered 2019-12-21: 50 ug via INTRAVENOUS

## 2019-12-21 MED ORDER — EPHEDRINE SULFATE 50 MG/ML IJ SOLN
INTRAMUSCULAR | Status: DC | PRN
  Administered 2019-12-21: 10 mg via INTRAVENOUS

## 2019-12-21 MED ORDER — OXYCODONE HCL 5 MG/5ML PO SOLN: 5.0000 mg | Freq: Once | ORAL | Status: DC | PRN

## 2019-12-21 SURGICAL SUPPLY — 43 items
ADH SKN CLS APL DERMABOND .7 (GAUZE/BANDAGES/DRESSINGS) ×1
APL PRP STRL LF DISP 70% ISPRP (MISCELLANEOUS) ×1
BLADE CLIPPER SURG (BLADE) IMPLANT
BLADE SURG 15 STRL LF DISP TIS (BLADE) ×1 IMPLANT
BLADE SURG 15 STRL SS (BLADE) ×2
CHLORAPREP W/TINT 26 (MISCELLANEOUS) ×2 IMPLANT
COVER BACK TABLE 60X90IN (DRAPES) ×2 IMPLANT
COVER MAYO STAND STRL (DRAPES) ×2 IMPLANT
COVER WAND RF STERILE (DRAPES) IMPLANT
DECANTER SPIKE VIAL GLASS SM (MISCELLANEOUS) IMPLANT
DERMABOND ADVANCED (GAUZE/BANDAGES/DRESSINGS) ×1
DERMABOND ADVANCED .7 DNX12 (GAUZE/BANDAGES/DRESSINGS) ×1 IMPLANT
DRAIN PENROSE 1/2X12 LTX STRL (WOUND CARE) ×2 IMPLANT
DRAPE LAPAROTOMY TRNSV 102X78 (DRAPES) ×2 IMPLANT
DRAPE UTILITY XL STRL (DRAPES) ×2 IMPLANT
ELECT COATED BLADE 2.86 ST (ELECTRODE) ×2 IMPLANT
ELECT REM PT RETURN 9FT ADLT (ELECTROSURGICAL) ×2
ELECTRODE REM PT RTRN 9FT ADLT (ELECTROSURGICAL) ×1 IMPLANT
GLOVE BIO SURGEON STRL SZ7 (GLOVE) ×2 IMPLANT
GLOVE BIOGEL PI IND STRL 7.5 (GLOVE) ×1 IMPLANT
GLOVE BIOGEL PI INDICATOR 7.5 (GLOVE) ×1
GLOVE ECLIPSE 6.5 STRL STRAW (GLOVE) ×2 IMPLANT
GLOVE SURG UNDER POLY LF SZ7 (GLOVE) ×3 IMPLANT
GOWN STRL REUS W/ TWL LRG LVL3 (GOWN DISPOSABLE) ×2 IMPLANT
GOWN STRL REUS W/TWL LRG LVL3 (GOWN DISPOSABLE) ×6
MESH ULTRAPRO 3X6 7.6X15CM (Mesh General) ×1 IMPLANT
NEEDLE HYPO 22GX1.5 SAFETY (NEEDLE) ×2 IMPLANT
NS IRRIG 1000ML POUR BTL (IV SOLUTION) ×1 IMPLANT
PACK BASIN DAY SURGERY FS (CUSTOM PROCEDURE TRAY) ×2 IMPLANT
PENCIL SMOKE EVACUATOR (MISCELLANEOUS) ×2 IMPLANT
SLEEVE SCD COMPRESS KNEE MED (MISCELLANEOUS) ×1 IMPLANT
SPONGE LAP 4X18 RFD (DISPOSABLE) ×2 IMPLANT
SUT MNCRL AB 4-0 PS2 18 (SUTURE) ×2 IMPLANT
SUT SILK 2 0 SH (SUTURE) IMPLANT
SUT VIC AB 0 SH 27 (SUTURE) IMPLANT
SUT VIC AB 2-0 SH 18 (SUTURE) ×3 IMPLANT
SUT VIC AB 2-0 SH 27 (SUTURE)
SUT VIC AB 2-0 SH 27XBRD (SUTURE) IMPLANT
SUT VIC AB 3-0 SH 27 (SUTURE) ×2
SUT VIC AB 3-0 SH 27X BRD (SUTURE) ×1 IMPLANT
SUT VICRYL AB 3 0 TIES (SUTURE) IMPLANT
SYR CONTROL 10ML LL (SYRINGE) ×2 IMPLANT
TOWEL GREEN STERILE FF (TOWEL DISPOSABLE) ×2 IMPLANT

## 2019-12-21 NOTE — Anesthesia Preprocedure Evaluation (Signed)
Anesthesia Evaluation  Patient identified by MRN, date of birth, ID band Patient awake    Reviewed: Allergy & Precautions, NPO status , Patient's Chart, lab work & pertinent test results  Airway Mallampati: II  TM Distance: >3 FB Neck ROM: Full    Dental no notable dental hx.    Pulmonary neg pulmonary ROS,    Pulmonary exam normal breath sounds clear to auscultation       Cardiovascular hypertension, Normal cardiovascular exam Rhythm:Regular Rate:Normal     Neuro/Psych negative neurological ROS  negative psych ROS   GI/Hepatic negative GI ROS, (+) Hepatitis -, B  Endo/Other  negative endocrine ROS  Renal/GU negative Renal ROS  negative genitourinary   Musculoskeletal negative musculoskeletal ROS (+)   Abdominal   Peds negative pediatric ROS (+)  Hematology  (+) HIV,   Anesthesia Other Findings   Reproductive/Obstetrics negative OB ROS                             Anesthesia Physical Anesthesia Plan  ASA: III  Anesthesia Plan: General   Post-op Pain Management:    Induction: Intravenous  PONV Risk Score and Plan: 2 and Ondansetron, Dexamethasone and Treatment may vary due to age or medical condition  Airway Management Planned: LMA  Additional Equipment:   Intra-op Plan:   Post-operative Plan: Extubation in OR  Informed Consent: I have reviewed the patients History and Physical, chart, labs and discussed the procedure including the risks, benefits and alternatives for the proposed anesthesia with the patient or authorized representative who has indicated his/her understanding and acceptance.     Dental advisory given  Plan Discussed with: CRNA and Surgeon  Anesthesia Plan Comments:         Anesthesia Quick Evaluation

## 2019-12-21 NOTE — Anesthesia Procedure Notes (Signed)
Procedure Name: LMA Insertion Date/Time: 12/21/2019 8:24 AM Performed by: Maryella Shivers, CRNA Pre-anesthesia Checklist: Patient identified, Emergency Drugs available, Suction available and Patient being monitored Patient Re-evaluated:Patient Re-evaluated prior to induction Oxygen Delivery Method: Circle system utilized Preoxygenation: Pre-oxygenation with 100% oxygen Induction Type: IV induction Ventilation: Mask ventilation without difficulty LMA: LMA inserted LMA Size: 5.0 Number of attempts: 1 Airway Equipment and Method: Bite block Placement Confirmation: positive ETCO2 Tube secured with: Tape Dental Injury: Teeth and Oropharynx as per pre-operative assessment

## 2019-12-21 NOTE — Op Note (Signed)
Preoperative diagnosis: Right inguinal hernia Postoperative diagnosis: Direct right inguinal hernia Procedure: Right inguinal hernia repair with mesh Allean Found) Surgeon: Dr. Serita Grammes Anesthesia: General with a tap block Estimated blood loss: Minimal Complications: None Drains: None Specimens: None Special count was correct completion Decision to recovery stable condition  Indications:54 yom with prior left inguinal hernia repair who presents with right groin bulge for few years. some discomfort. he has recently had pyelo and improved with abx. had possible prostatitis/proctitis by ct scan. ucx with e coli. completed abx a couple weeks ago and doing well. hernia has been present but would like to get repaired now. All urologic issues have been cleared now and ready for hernia repair.   We discussed an open inguinal hernia repair on the right side.  Procedure: After informed consent was obtained the patient first underwent a tap block.  He was given antibiotics.  SCDs were in place.  He was placed under general anesthesia with an LMA.  He was prepped and draped in the standard sterile surgical fashion.  A surgical timeout was then performed.  I filtrated Marcaine in the skin.  I made a right groin incision and carried this to the external abdominal oblique.  I entered this sharply.  I entered it through the external ring.  I then encircled the spermatic cord with a Penrose drain.  He did not have any evidence of an indirect inguinal hernia.  He did have a direct hernia present.  I reduced this and then closed the floor with 2-0 Vicryl.  I then used an ultra Pro mesh patch and fashioned this to cover the entire floor.  I sutured this with 2-0 Vicryl to the pubic tubercle in several positions.  I then made a T cut and wrapped around the spermatic cord.  Inferiorly I attached this to the inguinal ligament every half centimeter with 2-0 Vicryl.  I laid the lateral and flat underneath the oblique.  I  tacked the T cut ends together and secured them to the internal oblique.  This gave good coverage of the entire defect.  There was no more evidence of any hernia.  Hemostasis was observed.  I then closed the external oblique with 2-0 Vicryl.  Scarpa's was closed with 3-0 Vicryl and the skin was closed with 4-0 Monocryl.  Glue and Steri-Strips were applied.  He tolerated this well was extubated and transferred to recovery stable.

## 2019-12-21 NOTE — Anesthesia Procedure Notes (Signed)
Anesthesia Procedure Image    

## 2019-12-21 NOTE — Anesthesia Procedure Notes (Signed)
Anesthesia Regional Block: TAP block   Pre-Anesthetic Checklist: ,, timeout performed, Correct Patient, Correct Site, Correct Laterality, Correct Procedure, Correct Position, site marked, Risks and benefits discussed,  Surgical consent,  Pre-op evaluation,  At surgeon's request and post-op pain management  Laterality: Right  Prep: chloraprep       Needles:  Injection technique: Single-shot  Needle Type: Echogenic Needle     Needle Length: 9cm      Additional Needles:   Procedures:,,,, ultrasound used (permanent image in chart),,,,  Narrative:  Start time: 12/21/2019 8:01 AM End time: 12/21/2019 8:10 AM Injection made incrementally with aspirations every 5 mL.  Performed by: Personally  Anesthesiologist: Myrtie Soman, MD  Additional Notes: Patient tolerated the procedure well without complications

## 2019-12-21 NOTE — Progress Notes (Signed)
Assisted Dr. Rose with right, ultrasound guided, transabdominal plane block. Side rails up, monitors on throughout procedure. See vital signs in flow sheet. Tolerated Procedure well.  

## 2019-12-21 NOTE — H&P (Signed)
  20 yom with prior left inguinal hernia repair who presents with right groin bulge for few years. some discomfort. he has recently had pyelo and improved with abx. had possible prostatitis/proctitis by ct scan. ucx with e coli. completed abx a couple weeks ago and doing well. hernia has been present but would like to get repaired now. All urologic issues have been cleared now and ready for hernia repair.    Past Surgical History  Knee Surgery  Left. Open Inguinal Hernia Surgery  Left. Vasectomy   Diagnostic Studies History  Colonoscopy  5-10 years ago  Allergies Sulfa Antibiotics  Allergies Reconciled   Medication History Atorvastatin Calcium (10MG  Tablet, Oral) Active. Biktarvy (50-200-25MG  Tablet, Oral) Active. QUEtiapine Fumarate (25MG  Tablet, Oral) Active. SUMAtriptan Succinate (100MG  Tablet, Oral) Active. Medications Reconciled  Social History Alcohol use  Occasional alcohol use. Caffeine use  Coffee. No drug use  Tobacco use  Never smoker.  Family History  Alcohol Abuse  Brother. Arthritis  Mother. Breast Cancer  Mother. Cancer  Father. Hypertension  Brother.  Other Problems Back Pain  Bladder Problems  Enlarged Prostate  Hepatitis  High blood pressure  HIV-positive  Hypercholesterolemia  Inguinal Hernia  Migraine Headache    Review of Systems  General Not Present- Appetite Loss, Chills, Fatigue, Fever, Night Sweats, Weight Gain and Weight Loss. Skin Not Present- Change in Wart/Mole, Dryness, Hives, Jaundice, New Lesions, Non-Healing Wounds, Rash and Ulcer. HEENT Not Present- Earache, Hearing Loss, Hoarseness, Nose Bleed, Oral Ulcers, Ringing in the Ears, Seasonal Allergies, Sinus Pain, Sore Throat, Visual Disturbances, Wears glasses/contact lenses and Yellow Eyes. Respiratory Not Present- Bloody sputum, Chronic Cough, Difficulty Breathing, Snoring and Wheezing. Breast Not Present- Breast Mass, Breast Pain, Nipple Discharge and  Skin Changes. Cardiovascular Not Present- Chest Pain, Difficulty Breathing Lying Down, Leg Cramps, Palpitations, Rapid Heart Rate, Shortness of Breath and Swelling of Extremities. Gastrointestinal Present- Abdominal Pain. Not Present- Bloating, Bloody Stool, Change in Bowel Habits, Chronic diarrhea, Constipation, Difficulty Swallowing, Excessive gas, Gets full quickly at meals, Hemorrhoids, Indigestion, Nausea, Rectal Pain and Vomiting. Male Genitourinary Present- Change in Urinary Stream, Frequency and Urine Leakage. Not Present- Blood in Urine, Impotence, Nocturia, Painful Urination and Urgency. Musculoskeletal Present- Back Pain. Not Present- Joint Pain, Joint Stiffness, Muscle Pain, Muscle Weakness and Swelling of Extremities. Neurological Present- Headaches. Not Present- Decreased Memory, Fainting, Numbness, Seizures, Tingling, Tremor, Trouble walking and Weakness. Psychiatric Not Present- Anxiety, Bipolar, Change in Sleep Pattern, Depression, Fearful and Frequent crying. Endocrine Not Present- Cold Intolerance, Excessive Hunger, Hair Changes, Heat Intolerance, Hot flashes and New Diabetes. Hematology Present- HIV. Not Present- Blood Thinners, Easy Bruising, Excessive bleeding, Gland problems and Persistent Infections.   Physical Exam  General Mental Status-Alert. Orientation-Oriented X3. Abdomen Note: soft nontender no uh old lih scar, no lih reducible rih nontender  Assessment & Plan RIGHT INGUINAL HERNIA (K40.90) Story: RIH repair with mesh We discussed observation versus repair. We discussed both laparoscopic and open inguinal hernia repairs. I described the procedure in detail. Goals should be achieved with surgery. We discussed the usage of mesh and the rationale behind that. We went over the pathophysiology of inguinal hernia. We have elected to perform open inguinal hernia repair with mesh. We discussed the risks including bleeding, infection, recurrence, postoperative pain  and chronic groin pain, testicular injury, urinary retention, numbness in groin and around incision.

## 2019-12-21 NOTE — Anesthesia Postprocedure Evaluation (Signed)
Anesthesia Post Note  Patient: Sean Lawrence  Procedure(s) Performed: RIGHT INGUINAL HERNIA REPAIR WITH MESH (Right Groin) INSERTION OF MESH (Right Groin)     Patient location during evaluation: PACU Anesthesia Type: General Level of consciousness: awake and alert Pain management: pain level controlled Vital Signs Assessment: post-procedure vital signs reviewed and stable Respiratory status: spontaneous breathing, nonlabored ventilation, respiratory function stable and patient connected to nasal cannula oxygen Cardiovascular status: blood pressure returned to baseline and stable Postop Assessment: no apparent nausea or vomiting Anesthetic complications: no   No complications documented.  Last Vitals:  Vitals:   12/21/19 0945 12/21/19 1004  BP: (!) 124/91 120/90  Pulse: 63 72  Resp: (!) 0 18  Temp:  36.6 C  SpO2: 100% 100%    Last Pain:  Vitals:   12/21/19 1004  TempSrc: Oral  PainSc: 1                  Pattie Flaharty S

## 2019-12-21 NOTE — Interval H&P Note (Signed)
History and Physical Interval Note:  12/21/2019 8:05 AM  Sean Lawrence  has presented today for surgery, with the diagnosis of [place].  The various methods of treatment have been discussed with the patient and family. After consideration of risks, benefits and other options for treatment, the patient has consented to  Procedure(s): RIGHT INGUINAL HERNIA REPAIR WITH MESH (Right) as a surgical intervention.  The patient's history has been reviewed, patient examined, no change in status, stable for surgery.  I have reviewed the patient's chart and labs.  Questions were answered to the patient's satisfaction.     Rolm Bookbinder

## 2019-12-21 NOTE — Discharge Instructions (Signed)
[facility]  UMBILICAL OR INGUINAL HERNIA REPAIR: POST OP INSTRUCTIONS  Always review your discharge instruction sheet given to you by the facility where your surgery was performed. IF YOU HAVE DISABILITY OR FAMILY LEAVE FORMS, YOU MUST BRING THEM TO THE OFFICE FOR PROCESSING.   DO NOT GIVE THEM TO YOUR DOCTOR.  1. A  prescription for pain medication may be given to you upon discharge.  Take your pain medication as prescribed, if needed.  If narcotic pain medicine is not needed, then you may take acetaminophen (Tylenol), naprosyn (Alleve) or ibuprofen (Advil) as needed. 2. Take your usually prescribed medications unless otherwise directed. 3. If you need a refill on your pain medication, please contact your pharmacy.  They will contact our office to request authorization. Prescriptions will not be filled after 5 pm or on week-ends. 4. You should follow a light diet the first 24 hours after arrival home, such as soup and crackers, etc.  Be sure to include lots of fluids daily.  Resume your normal diet the day after surgery. 5. Most patients will experience some swelling and bruising around the umbilicus or in the groin and scrotum.  Ice packs and reclining will help.  Swelling and bruising can take several days to resolve.  6. It is common to experience some constipation if taking pain medication after surgery.  Increasing fluid intake and taking a stool softener (such as Colace) will usually help or prevent this problem from occurring.  A mild laxative (Milk of Magnesia or Miralax) should be taken according to package directions if there are no bowel movements after 48 hours. 7. Unless discharge instructions indicate otherwise, you may remove your bandages 48 hours after surgery, and you may shower at that time.  You may have steri-strips (small skin tapes) in place directly over the incision.  These strips should be left on the skin for 7-10 days and will come off on their own.   If your surgeon used skin glue on the incision, you may shower in 24 hours.  The glue will flake off over the next 2-3 weeks.  Any sutures or staples will be removed at the office during your follow-up visit. 8. ACTIVITIES:  You may resume regular (light) daily activities beginning the next day--such as daily self-care, walking, climbing stairs--gradually increasing activities as tolerated.  You may have sexual intercourse when it is comfortable.  Refrain from any heavy lifting or straining until approved by your doctor. a. You may drive when you are no longer taking prescription pain medication, you can comfortably wear a seatbelt, and you can safely maneuver your car and apply brakes. b. RETURN TO WORK:  __________________________________________________________ 9. You should see your doctor in the office for a follow-up appointment approximately 2-3 weeks after your surgery.  Make sure that you call for this appointment within a day or two after you arrive home to insure a convenient appointment time. 10. OTHER INSTRUCTIONS:  __________________________________________________________________________________________________________________________________________________________________________________________  WHEN TO CALL YOUR DOCTOR: 1. Fever over 101.0 2. Inability to urinate 3. Nausea and/or vomiting 4. Extreme swelling or bruising 5. Continued bleeding from incision. 6. Increased pain, redness, or drainage from the incision  The clinic staff is available to answer your questions during regular business hours.  Please don't hesitate to call and ask to speak to one of the nurses for clinical concerns.  If you have a medical emergency, go to the nearest emergency room or call 911.  A surgeon from [facility]  is always on call at the hospital   Post Anesthesia Home Care Instructions  Activity: Get plenty of rest for the remainder of the day. A responsible individual must stay  with you for 24 hours following the procedure.  For the next 24 hours, DO NOT: -Drive a car -Paediatric nurse -Drink alcoholic beverages -Take any medication unless instructed by your physician -Make any legal decisions or sign important papers.  Meals: Start with liquid foods such as gelatin or soup. Progress to regular foods as tolerated. Avoid greasy, spicy, heavy foods. If nausea and/or vomiting occur, drink only clear liquids until the nausea and/or vomiting subsides. Call your physician if vomiting continues.  Special Instructions/Symptoms: Your throat may feel dry or sore from the anesthesia or the breathing tube placed in your throat during surgery. If this causes discomfort, gargle with warm salt water. The discomfort should disappear within 24 hours.  If you had a scopolamine patch placed behind your ear for the management of post- operative nausea and/or vomiting:  1. The medication in the patch is effective for 72 hours, after which it should be removed.  Wrap patch in a tissue and discard in the trash. Wash hands thoroughly with soap and water. 2. You may remove the patch earlier than 72 hours if you experience unpleasant side effects which may include dry mouth, dizziness or visual disturbances. 3. Avoid touching the patch. Wash your hands with soap and water after contact with the patch.     No Tylenol until 1pm No ibuprofen until 3pm    [address] [place], [place], [place]  [address] ?  [address], [place], [place]   [address] [phone] ? [phone] ? FAX [phone] Web site: [email or web address]

## 2019-12-21 NOTE — Transfer of Care (Signed)
Immediate Anesthesia Transfer of Care Note  Patient: Sean Lawrence  Procedure(s) Performed: RIGHT INGUINAL HERNIA REPAIR WITH MESH (Right Groin) INSERTION OF MESH (Right Groin)  Patient Location: PACU  Anesthesia Type:GA combined with regional for post-op pain  Level of Consciousness: sedated  Airway & Oxygen Therapy: Patient Spontanous Breathing and Patient connected to nasal cannula oxygen  Post-op Assessment: Report given to RN and Post -op Vital signs reviewed and stable  Post vital signs: Reviewed and stable  Last Vitals:  Vitals Value Taken Time  BP 107/75 12/21/19 0930  Temp    Pulse 82 12/21/19 0933  Resp 8 12/21/19 0932  SpO2 99 % 12/21/19 0933  Vitals shown include unvalidated device data.  Last Pain:  Vitals:   12/21/19 0645  TempSrc: Oral  PainSc: 0-No pain      Patients Stated Pain Goal: 5 (76/22/63 3354)  Complications: No complications documented.

## 2019-12-22 ENCOUNTER — Encounter (HOSPITAL_BASED_OUTPATIENT_CLINIC_OR_DEPARTMENT_OTHER): Payer: Self-pay | Admitting: General Surgery

## 2020-02-07 ENCOUNTER — Telehealth: Payer: Self-pay | Admitting: *Deleted

## 2020-02-07 DIAGNOSIS — Z21 Asymptomatic human immunodeficiency virus [HIV] infection status: Secondary | ICD-10-CM

## 2020-02-07 DIAGNOSIS — B20 Human immunodeficiency virus [HIV] disease: Secondary | ICD-10-CM

## 2020-02-07 MED ORDER — BIKTARVY 50-200-25 MG PO TABS: 1.0000 | ORAL_TABLET | Freq: Every day | ORAL | 2 refills | Status: DC

## 2020-02-07 NOTE — Telephone Encounter (Signed)
Received request for prescription from [facility], new specialty pharmacy. RN sent new prescription of Biktarvy.  Andree Coss, RN

## 2020-07-04 DIAGNOSIS — G43719 Chronic migraine without aura, intractable, without status migrainosus: Secondary | ICD-10-CM | POA: Diagnosis not present

## 2020-07-04 DIAGNOSIS — G43111 Migraine with aura, intractable, with status migrainosus: Secondary | ICD-10-CM | POA: Diagnosis not present

## 2020-07-04 DIAGNOSIS — G43019 Migraine without aura, intractable, without status migrainosus: Secondary | ICD-10-CM | POA: Diagnosis not present

## 2020-08-22 ENCOUNTER — Other Ambulatory Visit: Payer: Self-pay

## 2020-08-22 ENCOUNTER — Other Ambulatory Visit (HOSPITAL_COMMUNITY)
Admission: RE | Admit: 2020-08-22 | Discharge: 2020-08-22 | Disposition: A | Discharge status: Home / Self Care | Payer: BC Managed Care – PPO | Source: Ambulatory Visit | Attending: Internal Medicine | Admitting: Internal Medicine

## 2020-08-22 ENCOUNTER — Other Ambulatory Visit: Payer: BC Managed Care – PPO

## 2020-08-22 DIAGNOSIS — Z21 Asymptomatic human immunodeficiency virus [HIV] infection status: Secondary | ICD-10-CM | POA: Insufficient documentation

## 2020-08-22 DIAGNOSIS — Z113 Encounter for screening for infections with a predominantly sexual mode of transmission: Secondary | ICD-10-CM | POA: Diagnosis not present

## 2020-08-22 DIAGNOSIS — Z79899 Other long term (current) drug therapy: Secondary | ICD-10-CM | POA: Diagnosis not present

## 2020-08-23 LAB — URINE CYTOLOGY ANCILLARY ONLY
Chlamydia: NEGATIVE
Comment: NEGATIVE
Comment: NORMAL
Neisseria Gonorrhea: NEGATIVE

## 2020-08-24 LAB — CBC WITH DIFFERENTIAL/PLATELET
Absolute Monocytes: 353 cells/uL (ref 200–950)
Basophils Absolute: 50 cells/uL (ref 0–200)
Basophils Relative: 0.9 %
Eosinophils Absolute: 403 cells/uL (ref 15–500)
Eosinophils Relative: 7.2 %
HCT: 47.9 % (ref 38.5–50.0)
Hemoglobin: 16.5 g/dL (ref 13.2–17.1)
Lymphs Abs: 2016 cells/uL (ref 850–3900)
MCH: 31.8 pg (ref 27.0–33.0)
MCHC: 34.4 g/dL (ref 32.0–36.0)
MCV: 92.3 fL (ref 80.0–100.0)
MPV: 9.8 fL (ref 7.5–12.5)
Monocytes Relative: 6.3 %
Neutro Abs: 2778 cells/uL (ref 1500–7800)
Neutrophils Relative %: 49.6 %
Platelets: 233 10*3/uL (ref 140–400)
RBC: 5.19 10*6/uL (ref 4.20–5.80)
RDW: 12.9 % (ref 11.0–15.0)
Total Lymphocyte: 36 %
WBC: 5.6 10*3/uL (ref 3.8–10.8)

## 2020-08-24 LAB — COMPLETE METABOLIC PANEL WITH GFR
AG Ratio: 1.9 (calc) (ref 1.0–2.5)
ALT: 19 U/L (ref 9–46)
AST: 19 U/L (ref 10–35)
Albumin: 4.6 g/dL (ref 3.6–5.1)
Alkaline phosphatase (APISO): 63 U/L (ref 35–144)
BUN: 15 mg/dL (ref 7–25)
CO2: 33 mmol/L — ABNORMAL HIGH (ref 20–32)
Calcium: 10.2 mg/dL (ref 8.6–10.3)
Chloride: 103 mmol/L (ref 98–110)
Creat: 1.28 mg/dL (ref 0.70–1.30)
Globulin: 2.4 g/dL (calc) (ref 1.9–3.7)
Glucose, Bld: 90 mg/dL (ref 65–99)
Potassium: 4.7 mmol/L (ref 3.5–5.3)
Sodium: 142 mmol/L (ref 135–146)
Total Bilirubin: 1.2 mg/dL (ref 0.2–1.2)
Total Protein: 7 g/dL (ref 6.1–8.1)
eGFR: 65 mL/min/{1.73_m2} (ref >=60)

## 2020-08-24 LAB — HIV-1 RNA QUANT-NO REFLEX-BLD
HIV 1 RNA Quant: NOT DETECTED Copies/mL
HIV-1 RNA Quant, Log: NOT DETECTED Log cps/mL

## 2020-08-24 LAB — LIPID PANEL
Cholesterol: 172 mg/dL (ref <200)
HDL: 48 mg/dL (ref >=40)
LDL Cholesterol (Calc): 99 mg/dL (calc)
Non-HDL Cholesterol (Calc): 124 mg/dL (calc) (ref <130)
Total CHOL/HDL Ratio: 3.6 (calc) (ref <5.0)
Triglycerides: 146 mg/dL (ref <150)

## 2020-08-24 LAB — RPR: RPR Ser Ql: NONREACTIVE

## 2020-08-24 LAB — T-HELPER CELL (CD4) - (RCID CLINIC ONLY)
CD4 % Helper T Cell: 41 % (ref 33–65)
CD4 T Cell Abs: 786 /uL (ref 400–1790)

## 2020-09-05 ENCOUNTER — Other Ambulatory Visit: Payer: Self-pay

## 2020-09-05 ENCOUNTER — Encounter: Payer: Self-pay | Admitting: Internal Medicine

## 2020-09-05 ENCOUNTER — Ambulatory Visit: Payer: BC Managed Care – PPO | Admitting: Internal Medicine

## 2020-09-05 VITALS — BP 138/96 | HR 84 | Temp 98.3°F | Resp 16 | Wt 192.2 lb

## 2020-09-05 DIAGNOSIS — Z5181 Encounter for therapeutic drug level monitoring: Secondary | ICD-10-CM

## 2020-09-05 DIAGNOSIS — Z21 Asymptomatic human immunodeficiency virus [HIV] infection status: Secondary | ICD-10-CM

## 2020-09-05 DIAGNOSIS — Z113 Encounter for screening for infections with a predominantly sexual mode of transmission: Secondary | ICD-10-CM

## 2020-09-05 DIAGNOSIS — Z79899 Other long term (current) drug therapy: Secondary | ICD-10-CM

## 2020-09-05 NOTE — Assessment & Plan Note (Signed)
Screened negative.  No risk factors presently

## 2020-09-05 NOTE — Assessment & Plan Note (Signed)
No concerns on labs, creat, LFTs ok.

## 2020-09-05 NOTE — Progress Notes (Signed)
   Subjective:    Patient ID: Sean Lawrence, male    DOB: 02/22/1965, 55 y.o.   MRN: [id number]  HPI Here for follow up of HIV He continues on Biktarvy with no missed doses.  No issues with obtaining, taking or tolerating the medication.  CD4 786 and viral load not detected.  No new complaints.  Is up to date with COVID-19 vaccines.  Has a garden, brought dill pickles from it!   Review of Systems  Constitutional:  Negative for unexpected weight change.  Gastrointestinal:  Negative for diarrhea and nausea.  Skin:  Negative for rash.      Objective:   Physical Exam Eyes:     General: No scleral icterus. Pulmonary:     Effort: Pulmonary effort is normal.  Skin:    Findings: No rash.  Neurological:     General: No focal deficit present.     Mental Status: He is alert.  Psychiatric:        Mood and Affect: Mood normal.    SH: no tobacco      Assessment & Plan:

## 2020-09-05 NOTE — Assessment & Plan Note (Signed)
He continues to do very well and no concerns.  Will continue with Biktarvy.  We did discuss [place] and he will consider in the future.

## 2020-10-02 DIAGNOSIS — L814 Other melanin hyperpigmentation: Secondary | ICD-10-CM | POA: Diagnosis not present

## 2020-10-02 DIAGNOSIS — C44619 Basal cell carcinoma of skin of left upper limb, including shoulder: Secondary | ICD-10-CM | POA: Diagnosis not present

## 2020-10-02 DIAGNOSIS — D2271 Melanocytic nevi of right lower limb, including hip: Secondary | ICD-10-CM | POA: Diagnosis not present

## 2020-10-02 DIAGNOSIS — D485 Neoplasm of uncertain behavior of skin: Secondary | ICD-10-CM | POA: Diagnosis not present

## 2020-10-02 DIAGNOSIS — L821 Other seborrheic keratosis: Secondary | ICD-10-CM | POA: Diagnosis not present

## 2020-10-02 DIAGNOSIS — Z85828 Personal history of other malignant neoplasm of skin: Secondary | ICD-10-CM | POA: Diagnosis not present

## 2020-10-02 DIAGNOSIS — L570 Actinic keratosis: Secondary | ICD-10-CM | POA: Diagnosis not present

## 2020-10-30 DIAGNOSIS — E7800 Pure hypercholesterolemia, unspecified: Secondary | ICD-10-CM | POA: Diagnosis not present

## 2020-10-30 DIAGNOSIS — G4700 Insomnia, unspecified: Secondary | ICD-10-CM | POA: Diagnosis not present

## 2020-10-30 DIAGNOSIS — I10 Essential (primary) hypertension: Secondary | ICD-10-CM | POA: Diagnosis not present

## 2020-10-30 DIAGNOSIS — Z0000 Encounter for general adult medical examination without abnormal findings: Secondary | ICD-10-CM | POA: Diagnosis not present

## 2020-10-30 DIAGNOSIS — G43709 Chronic migraine without aura, not intractable, without status migrainosus: Secondary | ICD-10-CM | POA: Diagnosis not present

## 2020-10-30 DIAGNOSIS — Z23 Encounter for immunization: Secondary | ICD-10-CM | POA: Diagnosis not present

## 2020-10-30 DIAGNOSIS — Z125 Encounter for screening for malignant neoplasm of prostate: Secondary | ICD-10-CM | POA: Diagnosis not present

## 2020-11-26 DIAGNOSIS — C44619 Basal cell carcinoma of skin of left upper limb, including shoulder: Secondary | ICD-10-CM | POA: Diagnosis not present

## 2021-01-01 DIAGNOSIS — G43019 Migraine without aura, intractable, without status migrainosus: Secondary | ICD-10-CM | POA: Diagnosis not present

## 2021-01-01 DIAGNOSIS — G43719 Chronic migraine without aura, intractable, without status migrainosus: Secondary | ICD-10-CM | POA: Diagnosis not present

## 2021-01-01 DIAGNOSIS — G43111 Migraine with aura, intractable, with status migrainosus: Secondary | ICD-10-CM | POA: Diagnosis not present

## 2021-01-24 ENCOUNTER — Other Ambulatory Visit: Payer: Self-pay | Admitting: Internal Medicine

## 2021-01-24 DIAGNOSIS — Z21 Asymptomatic human immunodeficiency virus [HIV] infection status: Secondary | ICD-10-CM

## 2021-01-24 DIAGNOSIS — B20 Human immunodeficiency virus [HIV] disease: Secondary | ICD-10-CM

## 2021-07-16 ENCOUNTER — Other Ambulatory Visit: Payer: Self-pay | Admitting: Internal Medicine

## 2021-07-16 DIAGNOSIS — B20 Human immunodeficiency virus [HIV] disease: Secondary | ICD-10-CM

## 2021-07-16 DIAGNOSIS — Z21 Asymptomatic human immunodeficiency virus [HIV] infection status: Secondary | ICD-10-CM

## 2021-09-05 ENCOUNTER — Other Ambulatory Visit (HOSPITAL_COMMUNITY)
Admission: RE | Admit: 2021-09-05 | Discharge: 2021-09-05 | Disposition: A | Discharge status: Home / Self Care | Payer: Managed Care, Other (non HMO) | Source: Ambulatory Visit | Attending: Internal Medicine | Admitting: Internal Medicine

## 2021-09-05 ENCOUNTER — Other Ambulatory Visit: Payer: Managed Care, Other (non HMO)

## 2021-09-05 ENCOUNTER — Other Ambulatory Visit: Payer: Self-pay

## 2021-09-05 DIAGNOSIS — Z21 Asymptomatic human immunodeficiency virus [HIV] infection status: Secondary | ICD-10-CM | POA: Insufficient documentation

## 2021-09-05 DIAGNOSIS — Z113 Encounter for screening for infections with a predominantly sexual mode of transmission: Secondary | ICD-10-CM | POA: Diagnosis present

## 2021-09-05 DIAGNOSIS — Z79899 Other long term (current) drug therapy: Secondary | ICD-10-CM

## 2021-09-06 LAB — URINE CYTOLOGY ANCILLARY ONLY
Chlamydia: NEGATIVE
Comment: NEGATIVE
Comment: NORMAL
Neisseria Gonorrhea: NEGATIVE

## 2021-09-06 LAB — T-HELPER CELL (CD4) - (RCID CLINIC ONLY)
CD4 % Helper T Cell: 43 % (ref 33–65)
CD4 T Cell Abs: 730 /uL (ref 400–1790)

## 2021-09-09 LAB — CBC WITH DIFFERENTIAL/PLATELET
Absolute Monocytes: 347 cells/uL (ref 200–950)
Basophils Absolute: 41 cells/uL (ref 0–200)
Basophils Relative: 0.8 %
Eosinophils Absolute: 367 cells/uL (ref 15–500)
Eosinophils Relative: 7.2 %
HCT: 48.3 % (ref 38.5–50.0)
Hemoglobin: 16.8 g/dL (ref 13.2–17.1)
Lymphs Abs: 1913 cells/uL (ref 850–3900)
MCH: 31.5 pg (ref 27.0–33.0)
MCHC: 34.8 g/dL (ref 32.0–36.0)
MCV: 90.6 fL (ref 80.0–100.0)
MPV: 9.9 fL (ref 7.5–12.5)
Monocytes Relative: 6.8 %
Neutro Abs: 2433 cells/uL (ref 1500–7800)
Neutrophils Relative %: 47.7 %
Platelets: 227 10*3/uL (ref 140–400)
RBC: 5.33 10*6/uL (ref 4.20–5.80)
RDW: 12.8 % (ref 11.0–15.0)
Total Lymphocyte: 37.5 %
WBC: 5.1 10*3/uL (ref 3.8–10.8)

## 2021-09-09 LAB — COMPLETE METABOLIC PANEL WITH GFR
AG Ratio: 1.9 (calc) (ref 1.0–2.5)
ALT: 19 U/L (ref 9–46)
AST: 19 U/L (ref 10–35)
Albumin: 4.6 g/dL (ref 3.6–5.1)
Alkaline phosphatase (APISO): 65 U/L (ref 35–144)
BUN: 16 mg/dL (ref 7–25)
CO2: 32 mmol/L (ref 20–32)
Calcium: 9.9 mg/dL (ref 8.6–10.3)
Chloride: 104 mmol/L (ref 98–110)
Creat: 1.16 mg/dL (ref 0.70–1.30)
Globulin: 2.4 g/dL (calc) (ref 1.9–3.7)
Glucose, Bld: 92 mg/dL (ref 65–99)
Potassium: 3.7 mmol/L (ref 3.5–5.3)
Sodium: 143 mmol/L (ref 135–146)
Total Bilirubin: 1.2 mg/dL (ref 0.2–1.2)
Total Protein: 7 g/dL (ref 6.1–8.1)
eGFR: 73 mL/min/{1.73_m2} (ref >=60)

## 2021-09-09 LAB — HIV-1 RNA QUANT-NO REFLEX-BLD
HIV 1 RNA Quant: NOT DETECTED Copies/mL
HIV-1 RNA Quant, Log: NOT DETECTED Log cps/mL

## 2021-09-09 LAB — LIPID PANEL
Cholesterol: 176 mg/dL (ref <200)
HDL: 51 mg/dL (ref >=40)
LDL Cholesterol (Calc): 105 mg/dL (calc) — ABNORMAL HIGH
Non-HDL Cholesterol (Calc): 125 mg/dL (calc) (ref <130)
Total CHOL/HDL Ratio: 3.5 (calc) (ref <5.0)
Triglycerides: 108 mg/dL (ref <150)

## 2021-09-09 LAB — RPR: RPR Ser Ql: NONREACTIVE

## 2021-09-18 ENCOUNTER — Other Ambulatory Visit: Payer: Self-pay | Admitting: Internal Medicine

## 2021-09-18 DIAGNOSIS — Z21 Asymptomatic human immunodeficiency virus [HIV] infection status: Secondary | ICD-10-CM

## 2021-09-18 DIAGNOSIS — B20 Human immunodeficiency virus [HIV] disease: Secondary | ICD-10-CM

## 2021-09-18 NOTE — Telephone Encounter (Signed)
Appt 8/17

## 2021-09-19 ENCOUNTER — Other Ambulatory Visit: Payer: Self-pay

## 2021-09-19 ENCOUNTER — Ambulatory Visit: Payer: Managed Care, Other (non HMO) | Admitting: Internal Medicine

## 2021-09-19 ENCOUNTER — Encounter: Payer: Self-pay | Admitting: Internal Medicine

## 2021-09-19 VITALS — BP 139/87 | HR 73 | Temp 98.0°F | Ht 71.0 in | Wt 191.0 lb

## 2021-09-19 DIAGNOSIS — Z79899 Other long term (current) drug therapy: Secondary | ICD-10-CM

## 2021-09-19 DIAGNOSIS — E7800 Pure hypercholesterolemia, unspecified: Secondary | ICD-10-CM | POA: Diagnosis not present

## 2021-09-19 DIAGNOSIS — Z113 Encounter for screening for infections with a predominantly sexual mode of transmission: Secondary | ICD-10-CM

## 2021-09-19 DIAGNOSIS — B20 Human immunodeficiency virus [HIV] disease: Secondary | ICD-10-CM

## 2021-09-19 DIAGNOSIS — Z21 Asymptomatic human immunodeficiency virus [HIV] infection status: Secondary | ICD-10-CM

## 2021-09-19 MED ORDER — BIKTARVY 50-200-25 MG PO TABS: 1.0000 | ORAL_TABLET | Freq: Every day | ORAL | 11 refills | Status: DC

## 2021-09-19 NOTE — Assessment & Plan Note (Signed)
Low risk.  Screened negative.

## 2021-09-19 NOTE — Assessment & Plan Note (Signed)
He continues to do great, no new concerns or issues with Biktarvy.  Labs reviewed with him and no issues.  rtc in 1 year.

## 2021-09-19 NOTE — Assessment & Plan Note (Signed)
Lipid panel noted

## 2021-09-19 NOTE — Progress Notes (Signed)
   Subjective:    Patient ID: Sean Lawrence, male    DOB: 05-28-1965, 56 y.o.   MRN: [id number]  HPI Here for follow up of HIV He continues on Biktarvy with no missed doses.  No new issues since last year.  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  No issues with getting, taking or tolerating his medication.     Review of Systems  Constitutional:  Negative for fatigue.  Gastrointestinal:  Negative for diarrhea and nausea.  Skin:  Negative for rash.       Objective:   Physical Exam Eyes:     General: No scleral icterus. Pulmonary:     Effort: Pulmonary effort is normal.  Neurological:     Mental Status: He is alert.   SH: no tobacco        Assessment & Plan:

## 2022-06-27 IMAGING — CT CT RENAL STONE PROTOCOL
2 of 4 series · 15 of 46 positions shown, 17 images · non-contrast
Comparison: None.

CLINICAL DATA: Flank pain, lower back pain began yesterday, now
with fevers

EXAM:
CT ABDOMEN AND PELVIS WITHOUT CONTRAST
TECHNIQUE: Multidetector CT imaging of the abdomen and pelvis was performed
following the standard protocol without IV contrast.

[Series 2: axial st · axial · 0.79mm/px · z∈[-538,-78]mm · 12 of 108 slices shown, 14 images]
[im 8/108  soft-tissue]
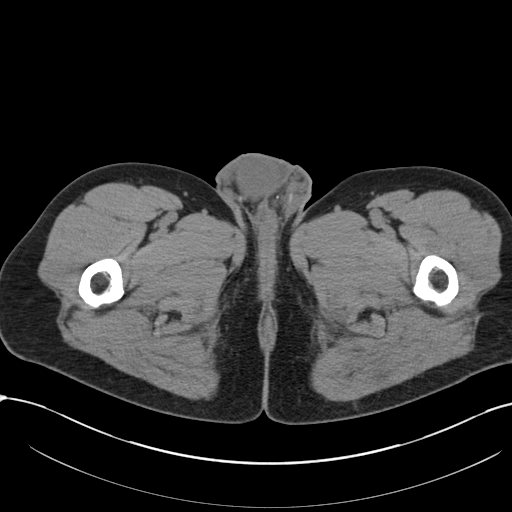
[im 8/108  bone]
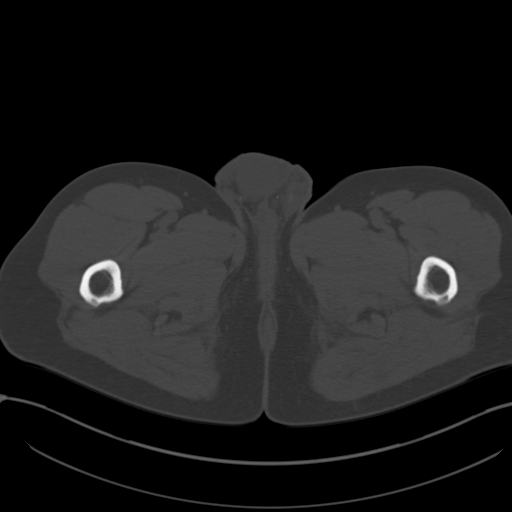
[im 15/108  soft-tissue]
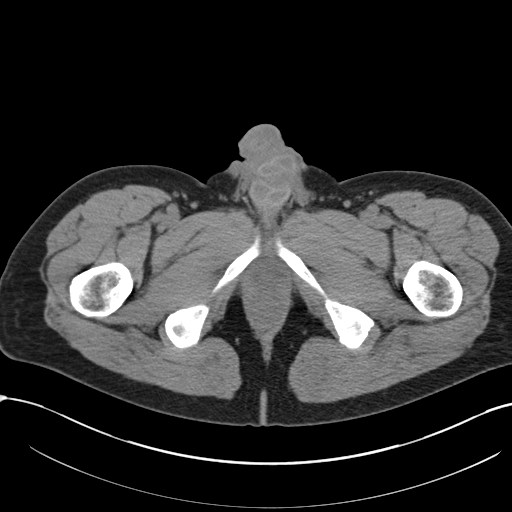
[im 22/108  soft-tissue]
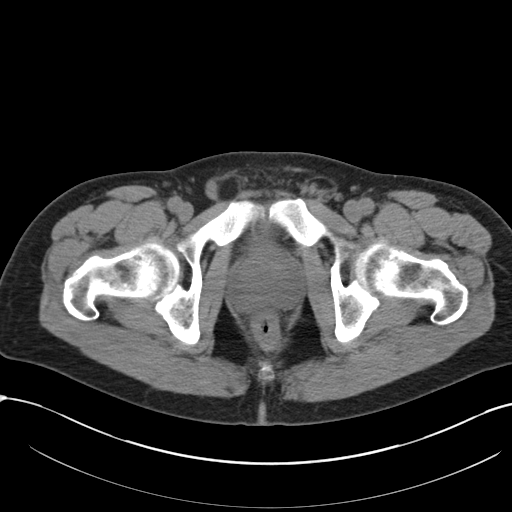
[im 36/108  soft-tissue]
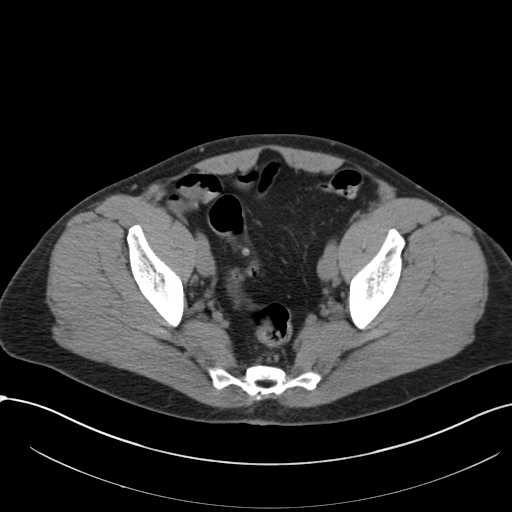
[im 43/108  soft-tissue]
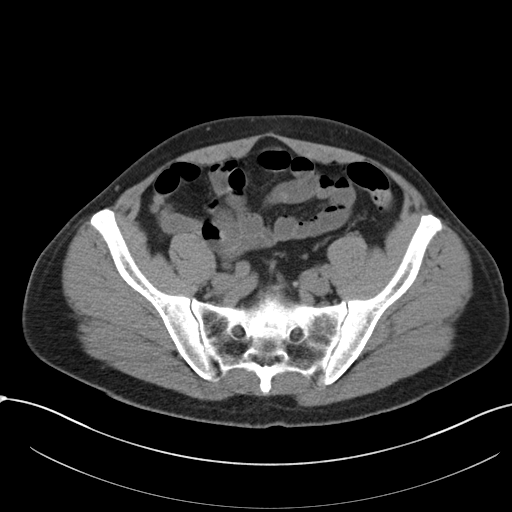
[im 50/108  soft-tissue]
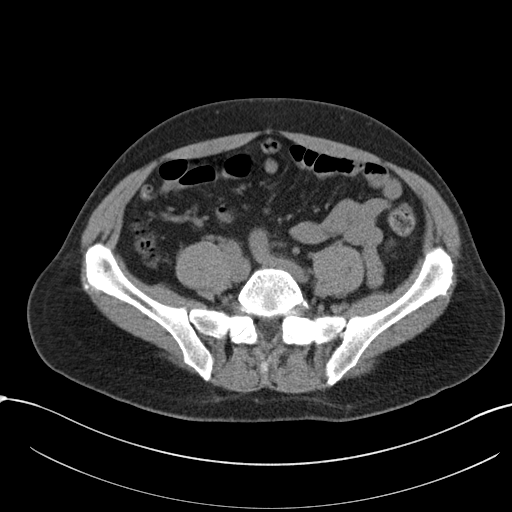
[im 58/108  soft-tissue]
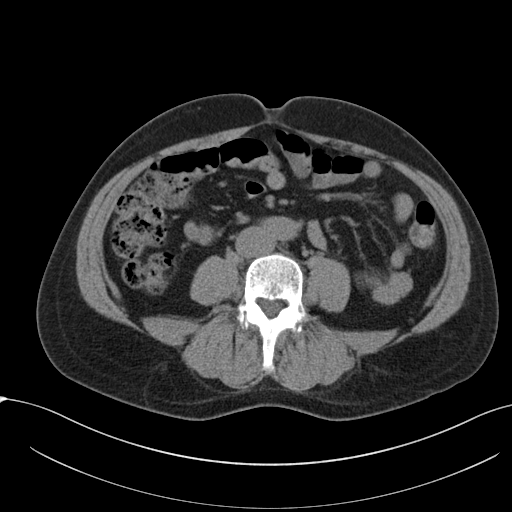
[im 65/108  soft-tissue]
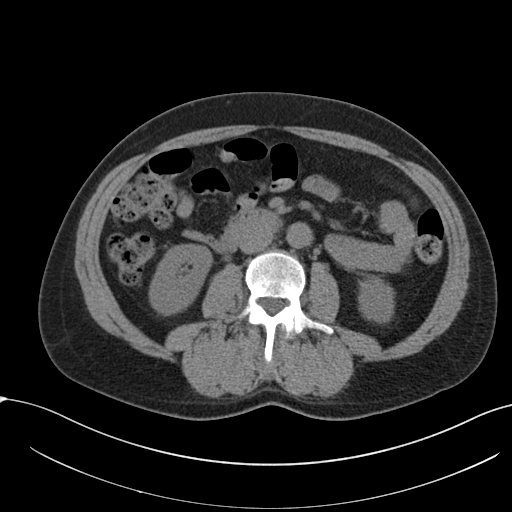
[im 72/108  soft-tissue]
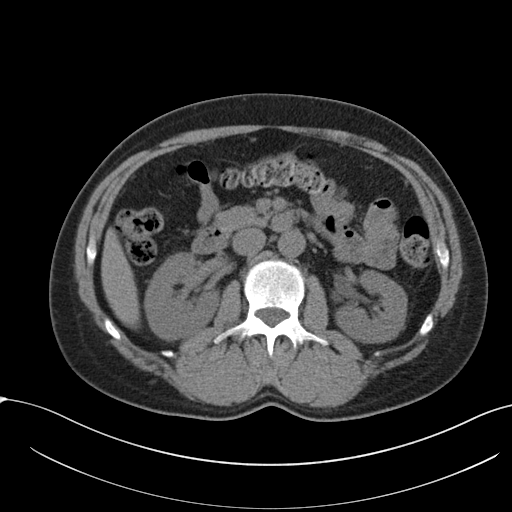
[im 72/108  bone]
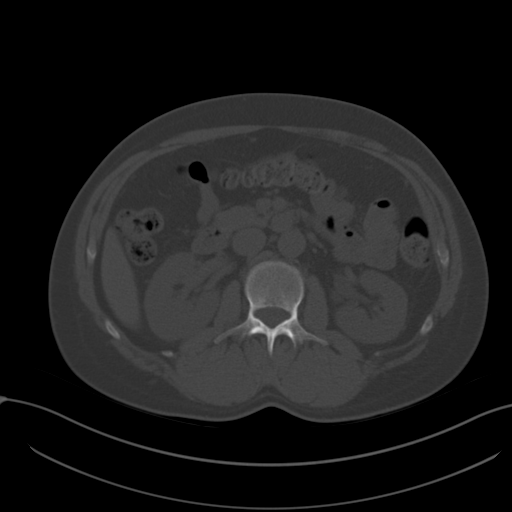
[im 86/108  soft-tissue]
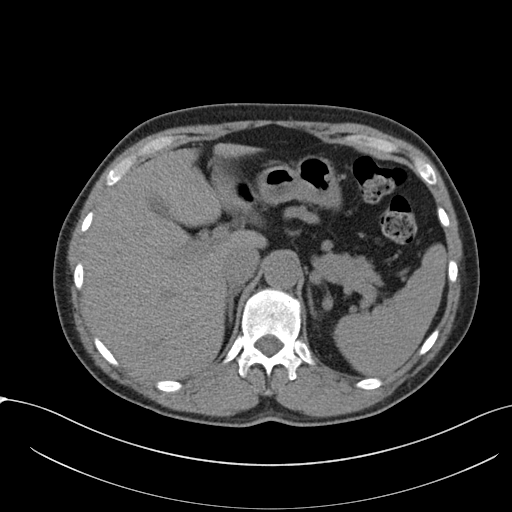
[im 93/108  soft-tissue]
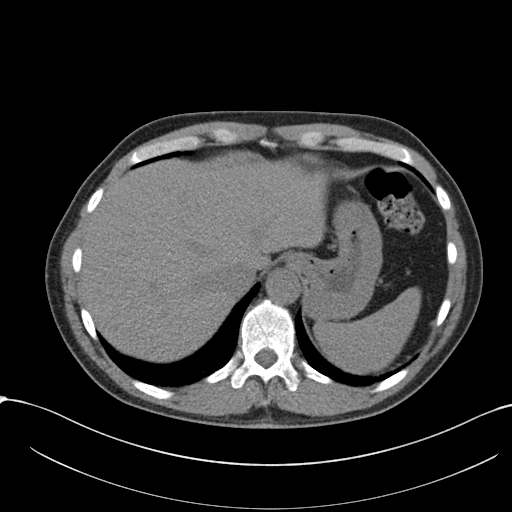
[im 100/108  soft-tissue]
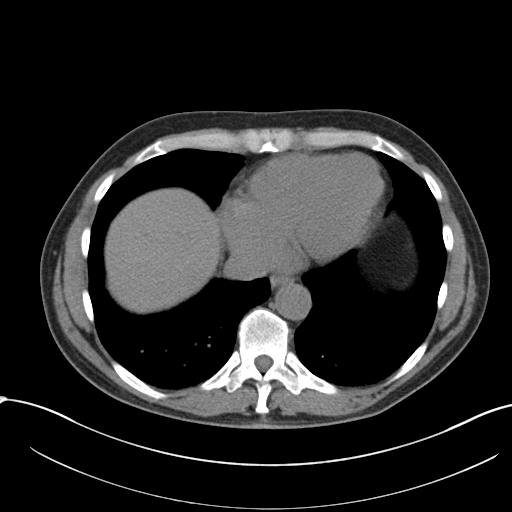

[Series 5: coronal · coronal · 0.75mm/px · 3 of 143 slices shown]
[im 48/143  soft-tissue]
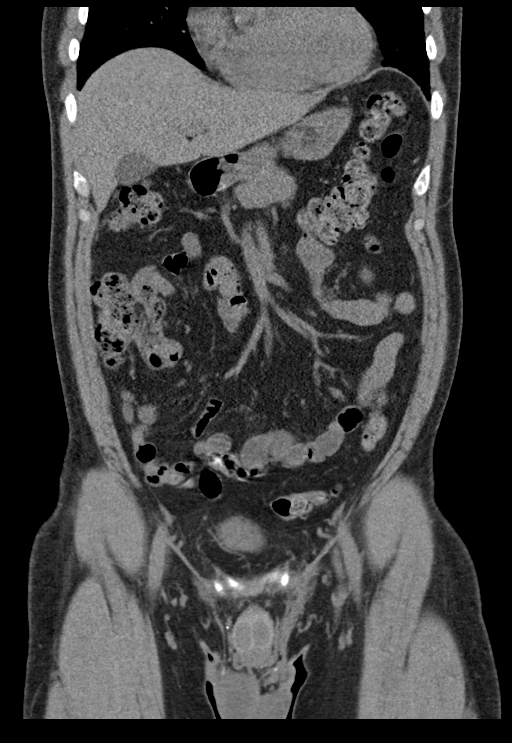
[im 64/143  soft-tissue]
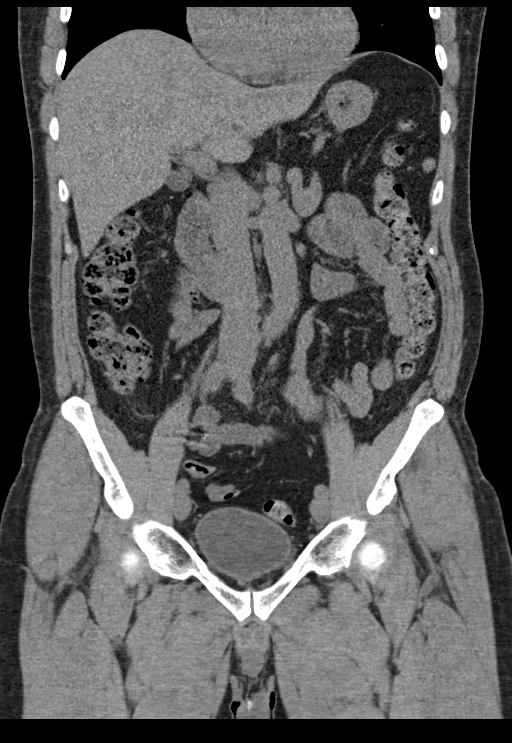
[im 79/143  soft-tissue]
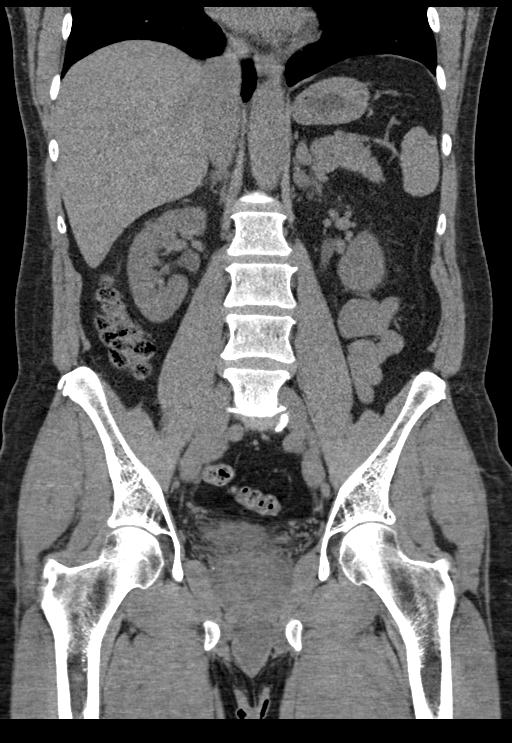

[15 of 46 positions shown; findings below may reference images not displayed]

FINDINGS: Lower chest: Lung bases are clear. Normal heart size. No pericardial
effusion.

Hepatobiliary: No visible worrisome focal liver lesions. Smooth
liver surface contour. Normal hepatic attenuation. Normal
gallbladder and biliary tree without visible calcified gallstones.

Pancreas: Unremarkable. No pancreatic ductal dilatation or
surrounding inflammatory changes.

Spleen: Normal in size without focal abnormality.

Adrenals/Urinary Tract: Normal adrenal glands. Kidneys are normally
located. There is slightly asymmetric left perinephric stranding
predominantly about the upper pole. No visible contour deforming
renal lesions. No urolithiasis or hydronephrosis. The urinary
bladder is circumferentially thickened with extensive perivesicular
hazy stranding.

Stomach/Bowel: Distal esophagus, stomach and duodenal sweep are
unremarkable. No small bowel wall thickening or dilatation. No
evidence of obstruction. A normal appendix is visualized. No colonic
dilatation or wall thickening. Scattered colonic diverticula without
focal inflammation to suggest diverticulitis. There may be slight
mild thickening of the rectum which is possibly reactive.

Vascular/Lymphatic: No significant vascular findings are present. No
enlarged abdominal or pelvic lymph nodes.

Reproductive: Prostatomegaly. Some hazy stranding appears centered
upon the prostate and seminal vesicles as well.

Other: No abdominopelvic free fluid or free gas. No bowel containing
hernias. Inflammatory changes centered upon the bladder, prostate
and seminal vesicles.

Musculoskeletal: No acute osseous abnormality or suspicious osseous
lesion. Multilevel degenerative changes are present in the imaged
portions of the spine. Additional degenerative changes in the hips
and pelvis.
IMPRESSION: 1. Circumferentially thickened urinary bladder with extensive
perivesicular hazy stranding, consistent with cystitis with some
left perinephric stranding as well which could implicate an
ascending tract infection. Correlate with urinalysis.
2. Some hazy stranding appears centered upon the prostate and
seminal and minimally thickened rectum as well which could reflect
concomitant prostatitis and proctitis.
3. Colonic diverticulosis without evidence of diverticulitis.

## 2022-09-17 ENCOUNTER — Other Ambulatory Visit: Payer: 59

## 2022-09-17 ENCOUNTER — Other Ambulatory Visit (HOSPITAL_COMMUNITY)
Admission: RE | Admit: 2022-09-17 | Discharge: 2022-09-17 | Disposition: A | Discharge status: Home / Self Care | Payer: 59 | Source: Ambulatory Visit | Attending: Internal Medicine | Admitting: Internal Medicine

## 2022-09-17 ENCOUNTER — Other Ambulatory Visit: Payer: Self-pay

## 2022-09-17 DIAGNOSIS — Z21 Asymptomatic human immunodeficiency virus [HIV] infection status: Secondary | ICD-10-CM

## 2022-09-17 DIAGNOSIS — Z113 Encounter for screening for infections with a predominantly sexual mode of transmission: Secondary | ICD-10-CM | POA: Diagnosis present

## 2022-09-17 DIAGNOSIS — E7800 Pure hypercholesterolemia, unspecified: Secondary | ICD-10-CM

## 2022-09-18 LAB — URINE CYTOLOGY ANCILLARY ONLY
Chlamydia: NEGATIVE
Comment: NEGATIVE
Comment: NORMAL
Neisseria Gonorrhea: NEGATIVE

## 2022-09-18 LAB — T-HELPER CELL (CD4) - (RCID CLINIC ONLY)
CD4 % Helper T Cell: 41 % (ref 33–65)
CD4 T Cell Abs: 698 /uL (ref 400–1790)

## 2022-09-19 LAB — COMPLETE METABOLIC PANEL WITH GFR
AG Ratio: 2 (calc) (ref 1.0–2.5)
ALT: 16 U/L (ref 9–46)
AST: 16 U/L (ref 10–35)
Albumin: 4.6 g/dL (ref 3.6–5.1)
Alkaline phosphatase (APISO): 69 U/L (ref 35–144)
BUN: 16 mg/dL (ref 7–25)
CO2: 30 mmol/L (ref 20–32)
Calcium: 10.1 mg/dL (ref 8.6–10.3)
Chloride: 104 mmol/L (ref 98–110)
Creat: 1.15 mg/dL (ref 0.70–1.30)
Globulin: 2.3 g/dL (ref 1.9–3.7)
Glucose, Bld: 87 mg/dL (ref 65–99)
Potassium: 4 mmol/L (ref 3.5–5.3)
Sodium: 142 mmol/L (ref 135–146)
Total Bilirubin: 0.6 mg/dL (ref 0.2–1.2)
Total Protein: 6.9 g/dL (ref 6.1–8.1)
eGFR: 73 mL/min/{1.73_m2} (ref >=60)

## 2022-09-19 LAB — CBC WITH DIFFERENTIAL/PLATELET
Absolute Monocytes: 488 {cells}/uL (ref 200–950)
Basophils Absolute: 48 {cells}/uL (ref 0–200)
Basophils Relative: 0.6 %
Eosinophils Absolute: 448 {cells}/uL (ref 15–500)
Eosinophils Relative: 5.6 %
HCT: 45.8 % (ref 38.5–50.0)
Hemoglobin: 15.4 g/dL (ref 13.2–17.1)
Lymphs Abs: 1816 {cells}/uL (ref 850–3900)
MCH: 30.9 pg (ref 27.0–33.0)
MCHC: 33.6 g/dL (ref 32.0–36.0)
MCV: 92 fL (ref 80.0–100.0)
MPV: 10 fL (ref 7.5–12.5)
Monocytes Relative: 6.1 %
Neutro Abs: 5200 {cells}/uL (ref 1500–7800)
Neutrophils Relative %: 65 %
Platelets: 223 10*3/uL (ref 140–400)
RBC: 4.98 10*6/uL (ref 4.20–5.80)
RDW: 13.1 % (ref 11.0–15.0)
Total Lymphocyte: 22.7 %
WBC: 8 10*3/uL (ref 3.8–10.8)

## 2022-09-19 LAB — LIPID PANEL
Cholesterol: 149 mg/dL (ref <200)
HDL: 54 mg/dL (ref >=40)
LDL Cholesterol (Calc): 81 mg/dL
Non-HDL Cholesterol (Calc): 95 mg/dL (ref <130)
Total CHOL/HDL Ratio: 2.8 (calc) (ref <5.0)
Triglycerides: 64 mg/dL (ref <150)

## 2022-09-19 LAB — HIV-1 RNA QUANT-NO REFLEX-BLD
HIV 1 RNA Quant: NOT DETECTED {copies}/mL
HIV-1 RNA Quant, Log: NOT DETECTED {Log_copies}/mL

## 2022-09-19 LAB — RPR: RPR Ser Ql: NONREACTIVE

## 2022-09-25 ENCOUNTER — Other Ambulatory Visit: Payer: Self-pay | Admitting: Internal Medicine

## 2022-09-25 DIAGNOSIS — Z21 Asymptomatic human immunodeficiency virus [HIV] infection status: Secondary | ICD-10-CM

## 2022-09-25 DIAGNOSIS — B20 Human immunodeficiency virus [HIV] disease: Secondary | ICD-10-CM

## 2022-10-01 ENCOUNTER — Other Ambulatory Visit: Payer: Self-pay

## 2022-10-01 ENCOUNTER — Encounter: Payer: Self-pay | Admitting: Internal Medicine

## 2022-10-01 ENCOUNTER — Ambulatory Visit (INDEPENDENT_AMBULATORY_CARE_PROVIDER_SITE_OTHER): Payer: 59 | Admitting: Internal Medicine

## 2022-10-01 VITALS — BP 133/87 | HR 71 | Temp 98.2°F | Ht 71.0 in | Wt 180.0 lb

## 2022-10-01 DIAGNOSIS — Z79899 Other long term (current) drug therapy: Secondary | ICD-10-CM

## 2022-10-01 DIAGNOSIS — Z23 Encounter for immunization: Secondary | ICD-10-CM | POA: Diagnosis not present

## 2022-10-01 DIAGNOSIS — Z5181 Encounter for therapeutic drug level monitoring: Secondary | ICD-10-CM | POA: Diagnosis not present

## 2022-10-01 DIAGNOSIS — Z113 Encounter for screening for infections with a predominantly sexual mode of transmission: Secondary | ICD-10-CM

## 2022-10-01 DIAGNOSIS — B20 Human immunodeficiency virus [HIV] disease: Secondary | ICD-10-CM | POA: Diagnosis not present

## 2022-10-01 DIAGNOSIS — Z21 Asymptomatic human immunodeficiency virus [HIV] infection status: Secondary | ICD-10-CM

## 2022-10-01 MED ORDER — BIKTARVY 50-200-25 MG PO TABS
1.0000 | ORAL_TABLET | Freq: Every day | ORAL | 11 refills | Status: DC
Start: 2022-10-01 — End: 2023-09-09

## 2022-10-01 NOTE — Assessment & Plan Note (Signed)
He continues with great compliance, no issues.  Labs reviewed with him.  No concerns.  Follow up in 1 year

## 2022-10-01 NOTE — Assessment & Plan Note (Signed)
Routine screening done, low risk.

## 2022-10-01 NOTE — Addendum Note (Signed)
Addended by: Gardiner Barefoot on: 10/01/2022 09:22 AM   Modules accepted: Orders

## 2022-10-01 NOTE — Assessment & Plan Note (Signed)
Creat normal, stable.  LFTs wnl.

## 2022-10-01 NOTE — Progress Notes (Signed)
   Subjective:    Patient ID: Sean Lawrence, male    DOB: 06-Jan-1966, 57 y.o.   MRN: [id number]  HPI Sean Lawrence is here for follow up of HIV He continues on Refugio with no missed doses.  No issues with getting or taking his medication.  Recently with Mohs surgery on his nose.  No new issues.    Review of Systems  Constitutional:  Negative for fatigue.  Gastrointestinal:  Negative for diarrhea and nausea.  Skin:  Negative for rash.       Objective:   Physical Exam Eyes:     General: No scleral icterus. Pulmonary:     Effort: Pulmonary effort is normal.  Neurological:     Mental Status: He is alert.   SH: no tobacco        Assessment & Plan:

## 2022-10-01 NOTE — Assessment & Plan Note (Signed)
Lipid panel checked and reviewed.  On statin.  Discussed the Reprieve trial

## 2023-05-16 ENCOUNTER — Other Ambulatory Visit: Payer: Self-pay | Admitting: Medical Genetics

## 2023-05-20 ENCOUNTER — Other Ambulatory Visit (HOSPITAL_COMMUNITY)
Admission: RE | Admit: 2023-05-20 | Discharge: 2023-05-20 | Disposition: A | Discharge status: Home / Self Care | Payer: Self-pay | Source: Ambulatory Visit | Attending: Medical Genetics | Admitting: Medical Genetics

## 2023-06-01 LAB — GENECONNECT MOLECULAR SCREEN: Genetic Analysis Overall Interpretation: NEGATIVE

## 2023-07-02 NOTE — Progress Notes (Signed)
 The 10-year ASCVD risk score (Arnett DK, et al., 2019) is: 6.8%   Values used to calculate the score:     Age: 58 years     Sex: Male     Is Non-Hispanic African American: No     Diabetic: No     Tobacco smoker: No     Systolic Blood Pressure: 123 mmHg     Is BP treated: Yes     HDL Cholesterol: 54 mg/dL     Total Cholesterol: 149 mg/dL  Currently prescribed atorvastatin  10 mg.  Elvis Boot, BSN, RN

## 2023-08-24 ENCOUNTER — Other Ambulatory Visit: Payer: Self-pay

## 2023-08-24 DIAGNOSIS — Z21 Asymptomatic human immunodeficiency virus [HIV] infection status: Secondary | ICD-10-CM

## 2023-08-24 DIAGNOSIS — Z113 Encounter for screening for infections with a predominantly sexual mode of transmission: Secondary | ICD-10-CM

## 2023-08-24 DIAGNOSIS — Z79899 Other long term (current) drug therapy: Secondary | ICD-10-CM

## 2023-08-27 ENCOUNTER — Other Ambulatory Visit (HOSPITAL_COMMUNITY)
Admission: RE | Admit: 2023-08-27 | Discharge: 2023-08-27 | Disposition: A | Discharge status: Home / Self Care | Source: Ambulatory Visit | Attending: Internal Medicine | Admitting: Internal Medicine

## 2023-08-27 ENCOUNTER — Other Ambulatory Visit: Payer: Self-pay

## 2023-08-27 ENCOUNTER — Other Ambulatory Visit: Payer: 59

## 2023-08-27 DIAGNOSIS — Z21 Asymptomatic human immunodeficiency virus [HIV] infection status: Secondary | ICD-10-CM

## 2023-08-27 DIAGNOSIS — Z113 Encounter for screening for infections with a predominantly sexual mode of transmission: Secondary | ICD-10-CM | POA: Diagnosis present

## 2023-08-27 DIAGNOSIS — Z79899 Other long term (current) drug therapy: Secondary | ICD-10-CM

## 2023-08-28 LAB — T-HELPER CELL (CD4) - (RCID CLINIC ONLY)
CD4 % Helper T Cell: 44 % (ref 33–65)
CD4 T Cell Abs: 750 /uL (ref 400–1790)

## 2023-08-28 LAB — URINE CYTOLOGY ANCILLARY ONLY
Chlamydia: NEGATIVE
Comment: NEGATIVE
Comment: NORMAL
Neisseria Gonorrhea: NEGATIVE

## 2023-08-29 LAB — COMPLETE METABOLIC PANEL WITHOUT GFR
AG Ratio: 1.8 (calc) (ref 1.0–2.5)
ALT: 15 U/L (ref 9–46)
AST: 18 U/L (ref 10–35)
Albumin: 4.4 g/dL (ref 3.6–5.1)
Alkaline phosphatase (APISO): 75 U/L (ref 35–144)
BUN: 19 mg/dL (ref 7–25)
CO2: 32 mmol/L (ref 20–32)
Calcium: 9.4 mg/dL (ref 8.6–10.3)
Chloride: 104 mmol/L (ref 98–110)
Creat: 1.03 mg/dL (ref 0.70–1.35)
Globulin: 2.5 g/dL (ref 1.9–3.7)
Glucose, Bld: 89 mg/dL (ref 65–99)
Potassium: 3.9 mmol/L (ref 3.5–5.3)
Sodium: 142 mmol/L (ref 135–146)
Total Bilirubin: 0.7 mg/dL (ref 0.2–1.2)
Total Protein: 6.9 g/dL (ref 6.1–8.1)

## 2023-08-29 LAB — CBC WITH DIFFERENTIAL/PLATELET
Absolute Lymphocytes: 1715 {cells}/uL (ref 850–3900)
Absolute Monocytes: 328 {cells}/uL (ref 200–950)
Basophils Absolute: 59 {cells}/uL (ref 0–200)
Basophils Relative: 1.2 %
Eosinophils Absolute: 358 {cells}/uL (ref 15–500)
Eosinophils Relative: 7.3 %
HCT: 48.7 % (ref 38.5–50.0)
Hemoglobin: 16.1 g/dL (ref 13.2–17.1)
MCH: 31.1 pg (ref 27.0–33.0)
MCHC: 33.1 g/dL (ref 32.0–36.0)
MCV: 94 fL (ref 80.0–100.0)
MPV: 9.9 fL (ref 7.5–12.5)
Monocytes Relative: 6.7 %
Neutro Abs: 2440 {cells}/uL (ref 1500–7800)
Neutrophils Relative %: 49.8 %
Platelets: 200 Thousand/uL (ref 140–400)
RBC: 5.18 Million/uL (ref 4.20–5.80)
RDW: 12.8 % (ref 11.0–15.0)
Total Lymphocyte: 35 %
WBC: 4.9 Thousand/uL (ref 3.8–10.8)

## 2023-08-29 LAB — LIPID PANEL
Cholesterol: 165 mg/dL (ref <200)
HDL: 55 mg/dL (ref >=40)
LDL Cholesterol (Calc): 91 mg/dL
Non-HDL Cholesterol (Calc): 110 mg/dL (ref <130)
Total CHOL/HDL Ratio: 3 (calc) (ref <5.0)
Triglycerides: 91 mg/dL (ref <150)

## 2023-08-29 LAB — RPR: RPR Ser Ql: NONREACTIVE

## 2023-08-29 LAB — HIV-1 RNA QUANT-NO REFLEX-BLD
HIV 1 RNA Quant: NOT DETECTED {copies}/mL
HIV-1 RNA Quant, Log: NOT DETECTED {Log_copies}/mL

## 2023-09-09 ENCOUNTER — Other Ambulatory Visit: Payer: Self-pay

## 2023-09-09 ENCOUNTER — Ambulatory Visit: Admitting: Internal Medicine

## 2023-09-09 ENCOUNTER — Encounter: Payer: Self-pay | Admitting: Internal Medicine

## 2023-09-09 VITALS — BP 147/94 | HR 76 | Temp 98.4°F | Ht 71.0 in | Wt 185.0 lb

## 2023-09-09 DIAGNOSIS — Z23 Encounter for immunization: Secondary | ICD-10-CM | POA: Diagnosis not present

## 2023-09-09 DIAGNOSIS — E785 Hyperlipidemia, unspecified: Secondary | ICD-10-CM

## 2023-09-09 DIAGNOSIS — B20 Human immunodeficiency virus [HIV] disease: Secondary | ICD-10-CM | POA: Diagnosis not present

## 2023-09-09 MED ORDER — ATORVASTATIN CALCIUM 40 MG PO TABS: 40.0000 mg | ORAL_TABLET | Freq: Every day | ORAL | 11 refills | Status: AC

## 2023-09-09 MED ORDER — BICTEGRAVIR-EMTRICITAB-TENOFOV 50-200-25 MG PO TABS: 1.0000 | ORAL_TABLET | Freq: Every day | ORAL | 11 refills | Status: AC

## 2023-09-09 NOTE — Addendum Note (Signed)
 Addended by: Sudeep Scheibel M on: 09/09/2023 02:51 PM   Modules accepted: Orders

## 2023-09-09 NOTE — Progress Notes (Signed)
   Subjective:    Patient ID: Sean Lawrence, male    DOB: 03-29-1965, 58 y.o.   MRN: [id number]  HPI Sean Lawrence is here for follow up of HIV He continues on Biktarvy  with no missed doses.  No issues with getting or taking his medication.  Recently with Sean Lawrence surgery on his nose.  No new issues.   09/09/23 [facility] First visit with me Compliant with biktarvy  no missed dose last 4 weeks See a&p for detail  Review of Systems  Constitutional:  Negative for fatigue.  Gastrointestinal:  Negative for diarrhea and nausea.  Skin:  Negative for rash.       Objective:   Physical Exam Eyes:     General: No scleral icterus. Pulmonary:     Effort: Pulmonary effort is normal.  Neurological:     Mental Status: He is alert.   Labs: Lab Results  Component Value Date   WBC 4.9 08/27/2023   HGB 16.1 08/27/2023   HCT 48.7 08/27/2023   MCV 94.0 08/27/2023   PLT 200 08/27/2023   Last metabolic panel Lab Results  Component Value Date   GLUCOSE 89 08/27/2023   NA 142 08/27/2023   K 3.9 08/27/2023   CL 104 08/27/2023   CO2 32 08/27/2023   BUN 19 08/27/2023   CREATININE 1.03 08/27/2023   EGFR 73 09/17/2022   CALCIUM  9.4 08/27/2023   PROT 6.9 08/27/2023   ALBUMIN 4.0 08/13/2019   BILITOT 0.7 08/27/2023   ALKPHOS 58 08/13/2019   AST 18 08/27/2023   ALT 15 08/27/2023   ANIONGAP 8 08/14/2019   HIV: 08/28/23        ud     /    750 (44%) 09/2022         ud     /    698 (41%) 2019                     /    860 (43%) 2012            ud     /    840 (37%)         Assessment & Plan:   #hiv -dx'ed 2011; heterosexual -no hx of aids -therapy Isentres/truvada --> biktarvy   09/09/2023. Wants to stick with biktarvy     -discussed u=u -encourage compliance -continue current HIV medication -labs reviewed -f/u in 1 year and can do labs after visit   #social Lives with wife who is also hiv positive/controlled; married 14 years right after he was diagnosed Have separate children  grown Working - Engineer, production No smoking/substance    #hcm -hlp/cad-stroke risk/reprieve trial On lipitor 10 -- discussed reprieve and will go to 40 mg daily -vaccination Shingrix vaccine with pcp 2021 Tdap 05/2022 Due for meningococcal booster -- given today -hepattitis 2016 hepatitis b sAb positive (will repeat next visit) -std screening 08/2023 rpr and urine gc/chlam negative -cancer screening Defer to pcp

## 2023-09-09 NOTE — Patient Instructions (Signed)
 Vaccine  -- meningitis    Labs reviewed -- all look great    Biktarvy  renewed    Lipitor new prescription for 40 mg daily (please google NEJM reprieve hiv, for detail)   See me in 1 year and we can do labs after the visit

## 2023-09-10 ENCOUNTER — Ambulatory Visit: Admitting: Internal Medicine

## 2023-09-10 ENCOUNTER — Ambulatory Visit: Payer: Self-pay | Admitting: Internal Medicine

## 2024-09-06 ENCOUNTER — Other Ambulatory Visit

## 2024-09-20 ENCOUNTER — Encounter: Payer: Self-pay | Admitting: Internal Medicine
